# Patient Record
Sex: Female | Born: 1950 | Race: White | Hispanic: No | Smoking: Never smoker
Health system: Southern US, Community
[De-identification: ages and names within clinical notes are randomized; demographics above are authoritative.]

## PROBLEM LIST (undated history)

## (undated) DIAGNOSIS — I10 Essential (primary) hypertension: Secondary | ICD-10-CM

## (undated) DIAGNOSIS — C50919 Malignant neoplasm of unspecified site of unspecified female breast: Secondary | ICD-10-CM

## (undated) DIAGNOSIS — C801 Malignant (primary) neoplasm, unspecified: Secondary | ICD-10-CM

## (undated) DIAGNOSIS — G4733 Obstructive sleep apnea (adult) (pediatric): Secondary | ICD-10-CM

## (undated) DIAGNOSIS — Z923 Personal history of irradiation: Secondary | ICD-10-CM

## (undated) HISTORY — DX: Essential (primary) hypertension: I10

## (undated) HISTORY — PX: BREAST BIOPSY: SHX20

## (undated) HISTORY — DX: Obstructive sleep apnea (adult) (pediatric): G47.33

## (undated) HISTORY — PX: BREAST EXCISIONAL BIOPSY: SUR124

## (undated) HISTORY — PX: BREAST LUMPECTOMY: SHX2

## (undated) HISTORY — DX: Malignant (primary) neoplasm, unspecified: C80.1

---

## 1998-01-08 DIAGNOSIS — C801 Malignant (primary) neoplasm, unspecified: Secondary | ICD-10-CM

## 1998-01-08 HISTORY — PX: BREAST SURGERY: SHX581

## 1998-01-08 HISTORY — DX: Malignant (primary) neoplasm, unspecified: C80.1

## 1998-04-01 ENCOUNTER — Encounter: Payer: Self-pay | Admitting: General Surgery

## 1998-04-04 ENCOUNTER — Ambulatory Visit (HOSPITAL_COMMUNITY): Admission: RE | Admit: 1998-04-04 | Discharge: 1998-04-05 | Payer: Self-pay | Admitting: General Surgery

## 1998-04-04 ENCOUNTER — Encounter: Payer: Self-pay | Admitting: General Surgery

## 1998-04-20 ENCOUNTER — Ambulatory Visit (HOSPITAL_COMMUNITY): Admission: RE | Admit: 1998-04-20 | Discharge: 1998-04-20 | Payer: Self-pay | Admitting: General Surgery

## 1998-04-27 ENCOUNTER — Encounter: Admission: RE | Admit: 1998-04-27 | Discharge: 1998-07-26 | Payer: Self-pay | Admitting: Family Medicine

## 1998-07-27 ENCOUNTER — Encounter: Admission: RE | Admit: 1998-07-27 | Discharge: 1998-10-25 | Payer: Self-pay | Admitting: Radiation Oncology

## 1998-08-09 ENCOUNTER — Encounter: Admission: RE | Admit: 1998-08-09 | Discharge: 1998-11-07 | Payer: Self-pay | Admitting: Family Medicine

## 2000-01-09 HISTORY — PX: ABDOMINAL HYSTERECTOMY: SHX81

## 2000-03-05 ENCOUNTER — Ambulatory Visit: Admission: RE | Admit: 2000-03-05 | Discharge: 2000-03-05 | Payer: Self-pay | Admitting: Gynecology

## 2000-03-07 ENCOUNTER — Other Ambulatory Visit: Admission: RE | Admit: 2000-03-07 | Discharge: 2000-03-07 | Payer: Self-pay | Admitting: Gynecology

## 2000-03-14 ENCOUNTER — Encounter: Payer: Self-pay | Admitting: Gynecology

## 2000-03-19 ENCOUNTER — Inpatient Hospital Stay (HOSPITAL_COMMUNITY): Admission: RE | Admit: 2000-03-19 | Discharge: 2000-03-23 | Payer: Self-pay | Admitting: Gynecology

## 2000-04-30 ENCOUNTER — Ambulatory Visit: Admission: RE | Admit: 2000-04-30 | Discharge: 2000-04-30 | Payer: Self-pay | Admitting: Gynecology

## 2004-06-06 ENCOUNTER — Encounter: Admission: RE | Admit: 2004-06-06 | Discharge: 2004-06-06 | Payer: Self-pay | Admitting: General Surgery

## 2010-08-03 ENCOUNTER — Other Ambulatory Visit: Payer: Self-pay | Admitting: General Surgery

## 2010-08-03 DIAGNOSIS — Z853 Personal history of malignant neoplasm of breast: Secondary | ICD-10-CM

## 2010-08-18 ENCOUNTER — Other Ambulatory Visit: Payer: Self-pay | Admitting: Family Medicine

## 2010-08-18 ENCOUNTER — Ambulatory Visit
Admission: RE | Admit: 2010-08-18 | Discharge: 2010-08-18 | Disposition: A | Discharge status: Home / Self Care | Payer: Managed Care, Other (non HMO) | Source: Ambulatory Visit | Attending: General Surgery | Admitting: General Surgery

## 2010-08-18 DIAGNOSIS — Z853 Personal history of malignant neoplasm of breast: Secondary | ICD-10-CM

## 2010-09-15 ENCOUNTER — Encounter (INDEPENDENT_AMBULATORY_CARE_PROVIDER_SITE_OTHER): Payer: Self-pay | Admitting: Surgery

## 2010-09-15 ENCOUNTER — Ambulatory Visit (INDEPENDENT_AMBULATORY_CARE_PROVIDER_SITE_OTHER): Payer: Managed Care, Other (non HMO) | Admitting: Surgery

## 2010-09-15 NOTE — Progress Notes (Signed)
Subjective:     Patient ID: Hannah Branch, female   DOB: 14-Jun-1950, 60 y.o.   MRN: [id number]  HPIMrs. Lorenzen has been our seminar and is injected in a Roux-en-Y gastric bypass. Her primary care doctor is Dr. Keturah Barre at [facility] family practice.she is a patient followed in the breast center and previously a breast cancer patient Dr. Mikal Plane. In April 2000 she had a right partial mastectomy with wide excision partial mastectomy.  As an adult, she has had issues with overweight. Currently her BMI is 58. Today's weight is 327. She wants to proceed along the workup pathway for a lap covered Roux-en-Y gastric bypass.   Review of Systems  Constitutional: Negative.   HENT: Negative.   Eyes: Negative.   Respiratory: Negative.   Cardiovascular: Negative.   Gastrointestinal: Negative.   Genitourinary: Negative.   Musculoskeletal: Negative.   Skin: Negative.   Neurological: Negative.   Hematological: Negative.   Psychiatric/Behavioral: Negative.    No Known Allergies Current Outpatient Prescriptions  Medication Sig Dispense Refill  . Ibuprofen-Diphenhydramine Cit (IBUPROFEN PM PO) Take by mouth at bedtime and may repeat dose one time if needed.        Marland Kitchen losartan-hydrochlorothiazide (HYZAAR) 100-25 MG per tablet Take 1 tablet by mouth daily.        . naproxen (NAPROSYN) 250 MG tablet Take 250 mg by mouth as needed.         Past Medical History  Diagnosis Date  . Cancer 2000    breast- right, uterine  . Hypertension    Past Surgical History  Procedure Date  . Breast surgery 2000    lumpectomy x2  . Abdominal hysterectomy 2002       Objective:   Physical Exam  Constitutional: She is oriented to person, place, and time. She appears well-nourished.  HENT:  Head: Normocephalic and atraumatic.  Eyes: Conjunctivae and EOM are normal. Pupils are equal, round, and reactive to light.  Neck: Neck supple.  Cardiovascular: Normal rate and regular rhythm.   Pulmonary/Chest: Effort  normal and breath sounds normal.  Abdominal: Soft.       Lower abdominal incision from hysterectomy  Musculoskeletal: Normal range of motion.  Neurological: She is alert and oriented to person, place, and time.  Skin: Skin is warm and dry.  Psychiatric: She has a normal mood and affect. Her behavior is normal. Judgment and thought content normal.  obese white female in no acute distress weight 327, BMI 58, blood pressure 150/98, afebrile, heart rate 80, respirations 12, height 5 feet 3 inches, and girth 60 inches.       Assessment:     Morbid obesity with BMI 58 History of breast cancer    Plan:     Workup for laparoscopic Roux en Y gastric bypass

## 2010-09-18 ENCOUNTER — Other Ambulatory Visit (INDEPENDENT_AMBULATORY_CARE_PROVIDER_SITE_OTHER): Payer: Self-pay | Admitting: Surgery

## 2010-09-21 ENCOUNTER — Encounter: Payer: Managed Care, Other (non HMO) | Attending: Surgery | Admitting: *Deleted

## 2010-09-21 ENCOUNTER — Other Ambulatory Visit (INDEPENDENT_AMBULATORY_CARE_PROVIDER_SITE_OTHER): Payer: Self-pay | Admitting: Surgery

## 2010-09-21 ENCOUNTER — Encounter: Payer: Self-pay | Admitting: *Deleted

## 2010-09-21 DIAGNOSIS — Z713 Dietary counseling and surveillance: Secondary | ICD-10-CM | POA: Insufficient documentation

## 2010-09-21 DIAGNOSIS — Z01818 Encounter for other preprocedural examination: Secondary | ICD-10-CM | POA: Insufficient documentation

## 2010-09-21 NOTE — Patient Instructions (Signed)
   Follow Pre-Op Nutrition Goals to prepare for Gastric Bypass Surgery.   Call the [facility] at [phone] once you have been given your surgery date to enrolled in the Pre-Op Nutrition Class. You will need to attend this nutrition class 3-4 weeks prior to your surgery. 

## 2010-09-21 NOTE — Progress Notes (Signed)
  Patient was seen on 09/21/2010 for Pre-Operative Gastric Bypass Nutrition Assessment. Assessment and letter of approval faxed to [facility] Bariatric Surgery Program coordinator on 09/21/2010.    Handouts given during visit include:  Pre-Op Goals Handout  Patient to call for Pre-Op and Post-Op Nutrition Education at the [facility] when surgery is scheduled.

## 2010-09-22 LAB — CBC
MCH: 28.1 pg (ref 26.0–34.0)
Platelets: 183 10*3/uL (ref 150–400)
RBC: 4.34 MIL/uL (ref 3.87–5.11)
WBC: 4.3 10*3/uL (ref 4.0–10.5)

## 2010-09-22 LAB — COMPREHENSIVE METABOLIC PANEL
ALT: 13 U/L (ref 0–35)
CO2: 29 mEq/L (ref 19–32)
Chloride: 99 mEq/L (ref 96–112)
Sodium: 140 mEq/L (ref 135–145)
Total Bilirubin: 0.6 mg/dL (ref 0.3–1.2)
Total Protein: 7.4 g/dL (ref 6.0–8.3)

## 2010-09-22 LAB — LIPID PANEL
Cholesterol: 180 mg/dL (ref 0–200)
VLDL: 29 mg/dL (ref 0–40)

## 2010-09-22 LAB — TSH: TSH: 0.993 u[IU]/mL (ref 0.350–4.500)

## 2010-09-28 ENCOUNTER — Other Ambulatory Visit: Payer: Self-pay

## 2010-09-28 ENCOUNTER — Ambulatory Visit (HOSPITAL_COMMUNITY)
Admission: RE | Admit: 2010-09-28 | Discharge: 2010-09-28 | Disposition: A | Discharge status: Home / Self Care | Payer: Managed Care, Other (non HMO) | Source: Ambulatory Visit | Attending: Surgery | Admitting: Surgery

## 2010-09-28 DIAGNOSIS — I10 Essential (primary) hypertension: Secondary | ICD-10-CM | POA: Insufficient documentation

## 2010-09-28 DIAGNOSIS — K449 Diaphragmatic hernia without obstruction or gangrene: Secondary | ICD-10-CM | POA: Insufficient documentation

## 2010-09-28 DIAGNOSIS — Z6841 Body Mass Index (BMI) 40.0 and over, adult: Secondary | ICD-10-CM | POA: Insufficient documentation

## 2010-10-10 ENCOUNTER — Ambulatory Visit (HOSPITAL_COMMUNITY)
Admission: RE | Admit: 2010-10-10 | Discharge: 2010-10-10 | Disposition: A | Discharge status: Home / Self Care | Payer: Managed Care, Other (non HMO) | Source: Ambulatory Visit | Attending: Surgery | Admitting: Surgery

## 2010-10-10 ENCOUNTER — Ambulatory Visit (HOSPITAL_BASED_OUTPATIENT_CLINIC_OR_DEPARTMENT_OTHER): Payer: Managed Care, Other (non HMO) | Attending: Surgery

## 2010-10-10 DIAGNOSIS — G4733 Obstructive sleep apnea (adult) (pediatric): Secondary | ICD-10-CM | POA: Insufficient documentation

## 2010-10-10 DIAGNOSIS — Z6841 Body Mass Index (BMI) 40.0 and over, adult: Secondary | ICD-10-CM | POA: Insufficient documentation

## 2010-10-10 DIAGNOSIS — Z01818 Encounter for other preprocedural examination: Secondary | ICD-10-CM | POA: Insufficient documentation

## 2010-10-10 HISTORY — DX: Obstructive sleep apnea (adult) (pediatric): G47.33

## 2010-10-14 DIAGNOSIS — R0609 Other forms of dyspnea: Secondary | ICD-10-CM

## 2010-10-14 DIAGNOSIS — G4733 Obstructive sleep apnea (adult) (pediatric): Secondary | ICD-10-CM

## 2010-10-14 DIAGNOSIS — R0989 Other specified symptoms and signs involving the circulatory and respiratory systems: Secondary | ICD-10-CM

## 2010-10-14 NOTE — Procedures (Signed)
Hannah Branch, MUSIC                 ACCOUNT NO.:  [id number]  MEDICAL RECORD NO.:  [id number]          PATIENT TYPE:  OUT  LOCATION:  SLEEP CENTER                 FACILITY:  [facility]  PHYSICIAN:  Adaleah Forget D. Maple Hudson, MD, FCCP, FACPDATE OF BIRTH:  July 23[birth year] 1949  DATE OF STUDY:  10/10/2010                           NOCTURNAL POLYSOMNOGRAM  REFERRING PHYSICIAN:  Thornton Park. Daphine Deutscher, MD  INDICATION FOR STUDY:  Hypersomnia with sleep apnea.  EPWORTH SLEEPINESS SCORE:  Epworth sleepiness score 5/24.  BMI 53.1. Weight 300 pounds, height 63 inches.  Neck 17 inches.  Home medications are charted and reviewed.  MEDICATIONS:  SLEEP ARCHITECTURE:  Split study protocol.  During the diagnostic phase, total sleep time 137 minutes with sleep efficiency 83.3%.  Stage I was 14.2%, stage II 85.8%, stages III and REM were absent.  Sleep latency 8.5 minutes, awake after sleep onset 18.5 minutes, arousal index 83.2. Bedtime medication:  None.  RESPIRATORY DATA:  Split study protocol.  Apnea/hypopnea index (AHI) 122.2 per hour.  A total of 279 events were scored including 82 obstructive apneas, 2 central apneas, 3 mixed apneas, 192 hypopneas. Events were not positional.  CPAP was then titrated to 19 CWP, AHI 0 per hour.  She wore a medium ResMed Mirage Quattro full-face mask with heated humidifier and a C-flex setting of 3.  OXYGEN DATA:  Moderately loud snoring with oxygen desaturation to a nadir of 65% on room air.  During CPAP titration mean oxygen saturation held 86.9% on room air.  CARDIAC DATA:  Normal sinus rhythm.  MOVEMENT-PARASOMNIA:  No significant movement disturbance.  Bathroom x1.  IMPRESSIONS-RECOMMENDATIONS: 1. Severe obstructive sleep apnea/hypopnea syndrome, AHI 122.2 per     hour with nonpositional events, moderately loud snoring and oxygen     desaturation to a nadir of 65% on room air. 2. Successful CPAP titration to 19 CWP, AHI 0 per hour.  The patient     wore a medium ResMed Mirage  Quattro full-face mask with heated     humidifier and a C-flex setting of 3. 3. Room air oxygen saturation on arrival was normal at 96%.  There was     significant desaturation during sleep as noted.  The oxygen     saturation recorded during CPAP titration remained low at 86.9% on     room air raising     possibility that this patient desaturates significantly during     sleep and may benefit from an overnight oximetry assessment while     wearing CPAP on room air in the home environment.     Ranell Finelli D. Maple Hudson, MD, [facility], FACP Diplomate, Biomedical engineer of Sleep Medicine Electronically Signed    CDY/MEDQ  D:  10/14/2010 11:26:30  T:  10/14/2010 11:43:56  Job:  [id number]

## 2010-11-13 ENCOUNTER — Other Ambulatory Visit (INDEPENDENT_AMBULATORY_CARE_PROVIDER_SITE_OTHER): Payer: Self-pay | Admitting: Surgery

## 2010-11-14 ENCOUNTER — Other Ambulatory Visit (INDEPENDENT_AMBULATORY_CARE_PROVIDER_SITE_OTHER): Payer: Self-pay | Admitting: Surgery

## 2010-11-15 ENCOUNTER — Encounter: Payer: Self-pay | Admitting: Pulmonary Disease

## 2010-11-15 ENCOUNTER — Ambulatory Visit (INDEPENDENT_AMBULATORY_CARE_PROVIDER_SITE_OTHER): Payer: Managed Care, Other (non HMO) | Admitting: Pulmonary Disease

## 2010-11-15 VITALS — BP 122/76 | HR 80 | Temp 98.5°F | Ht 63.0 in | Wt 318.4 lb

## 2010-11-15 DIAGNOSIS — G4733 Obstructive sleep apnea (adult) (pediatric): Secondary | ICD-10-CM

## 2010-11-15 NOTE — Assessment & Plan Note (Signed)
She has very severe sleep apnea.  I have reviewed his sleep test results with the patient.  Explained how sleep apnea can affect the patient's health.  Driving precautions and importance of weight loss were discussed.  Treatment options for sleep apnea were reviewed.  Will proceed with CPAP set up at 19 cm H2O.  She will need to do overnight oximetry while on CPAP to determine if she needs to use supplemental oxygen with CPAP.  She can otherwise proceed with bariatric surgery.  Pulmonary service can be available as needed during the peri-operative period.    Advised that she will need to have her sleep apnea re-assessed once her weight is stablized after surgery.

## 2010-11-15 NOTE — Patient Instructions (Signed)
Follow:    Pre-Op Diet per MD 2 weeks prior to surgery  Phase 2- Liquids (clear/full) 2 weeks after surgery  Vitamin/Mineral/Calcium guidelines for purchasing bariatric supplements  Exercise guidelines pre and post-op per MD  Follow-up at [facility] in 2 weeks post-op for diet advancement. Contact Keniesha Adderly as needed with questions/concerns.  

## 2010-11-15 NOTE — Progress Notes (Signed)
Chief Complaint  Patient presents with  . Advice Only    Pt had sleep study 10/2010 and did not get results yet. Pt scheduled to have gastric bypass 12/12/10. Pt c/o waking up several times during the night (due to using bathroom), loud snoring    History of Present Illness: Hannah Branch is a 60 y.o. female seen for sleep apnea.  She is being evaluated for bariatric surgery.  During this evaluation there was concern for sleep apnea.  She had sleep test from October 10, 2010>>AHI 122.2, SpO2 low 65%.  She used CPAP 19 cm H2O with AHI reduced to 0.  She did still have trouble with her oxygenation even with CPAP, but did not use oxygen during the test.  She goes to bed at 10 pm.  She falls asleep quickly.  She wakes up several times to use the bathroom.  She will occasionally take a tylenol pm.  She gets out of bed at 6 am.  She feels tired at times in the morning.  She gets frequent headaches during the day.  She does snore, but is unsure if she stops breathing while asleep.  She is unaware otherwise of having trouble with her sleep.  She does not use anything to help his stay awake during the day.  The patient denies sleep walking, sleep talking, bruxism, or nightmares.  There is no history of restless legs.  The patient denies sleep hallucinations, sleep paralysis, or cataplexy.  Her Epworth score is 3 out of 24.  Past Medical History  Diagnosis Date  . Cancer 2000    breast- right, uterine  . Hypertension   . OSA (obstructive sleep apnea) 10/10/10    Past Surgical History  Procedure Date  . Breast surgery 2000    lumpectomy x2 right  . Abdominal hysterectomy 2002    Current Outpatient Prescriptions on File Prior to Visit  Medication Sig Dispense Refill  . Ibuprofen-Diphenhydramine Cit (IBUPROFEN PM PO) Take by mouth at bedtime and may repeat dose one time if needed.        Marland Kitchen losartan-hydrochlorothiazide (HYZAAR) 100-25 MG per tablet Take 1 tablet by mouth daily.          No  Known Allergies  family history includes Cancer in her maternal aunt and maternal grandmother and Emphysema in her father.   reports that she has never smoked. She does not have any smokeless tobacco history on file. She reports that she does not drink alcohol or use illicit drugs.  Blood pressure 122/76, pulse 80, temperature 98.5 F (36.9 C), temperature source Oral, height 5\' 3"  (1.6 m), weight 318 lb 6.4 oz (144.425 kg), SpO2 96.00%.  Physical Exam:  General - Obese HEENT - PERRLA, EOMI, no sinus tenderness, MP 3, no oral exudate, no LAN, no thyromegaly Cardiac - s1s2 no murmur Chest - no wheeze/rales/dullness Abdomen - soft, nontender Extremities - no e/c/c Neurologic - normal strength, CN intact Skin - no rashes Psychiatric - normal mood, behavior  Assessment/Plan:  OSA (obstructive sleep apnea) She has very severe sleep apnea.  I have reviewed his sleep test results with the patient.  Explained how sleep apnea can affect the patient's health.  Driving precautions and importance of weight loss were discussed.  Treatment options for sleep apnea were reviewed.  Will proceed with CPAP set up at 19 cm H2O.  She will need to do overnight oximetry while on CPAP to determine if she needs to use supplemental oxygen with CPAP.  She  can otherwise proceed with bariatric surgery.  Pulmonary service can be available as needed during the peri-operative period.    Advised that she will need to have her sleep apnea re-assessed once her weight is stablized after surgery.     Outpatient Encounter Prescriptions as of 11/15/2010  Medication Sig Dispense Refill  . Ibuprofen-Diphenhydramine Cit (IBUPROFEN PM PO) Take by mouth at bedtime and may repeat dose one time if needed.        Marland Kitchen losartan-hydrochlorothiazide (HYZAAR) 100-25 MG per tablet Take 1 tablet by mouth daily.        Marland Kitchen DISCONTD: naproxen (NAPROSYN) 250 MG tablet Take 250 mg by mouth as needed.          Hannah Branch 11/15/2010,  3:55 PM

## 2010-11-15 NOTE — Progress Notes (Deleted)
  Subjective:    Patient ID: Hannah Branch, female    DOB: Sep 05, 1950, 60 y.o.   MRN: [id number]  HPI    Review of Systems  Respiratory: Positive for shortness of breath.   Musculoskeletal: Positive for joint swelling.       Objective:   Physical Exam        Assessment & Plan:

## 2010-11-15 NOTE — Patient Instructions (Signed)
Will arrange for CPAP set up Follow up in 2 to 3 months 

## 2010-11-15 NOTE — Progress Notes (Signed)
  Bariatric Class:  Appt start time: 1700 end time:  1800.  Pre-Operative Nutrition Class  Patient was seen on 11/16/10 for Pre-Operative Nutrition education at [facility].   Surgery date: 12/12/10 Surgery type: Gastric Bypass  Weight today: 322.4lb  Weight change: 1.1 lb increase Total weight lost: N/A BMI: 57  Samples given per MNT protocol: Bariatric Advantage Multivitamin Lot #[id number] Exp: 9/13  Bariatric Advantage Calcium Citrate Lot # [id number] Exp: 9/13  Celebrate Vitamins Multivitamin Lot # [id number] Exp: 6/14  Celebrate Vitamins Calcium Citrate Lot # [id number] Exp: 9/13  Unjury Protein: Chocolate Lot# [id number] Exp: 1/14  The following the learning objective met by the patient during this course:   Identifies Pre-Op Dietary Goals and will begin 2 weeks pre-operatively   Identifies appropriate sources of fluids and proteins   States protein recommendations and appropriate sources pre and post-operatively  Identifies Post-Operative Dietary Goals and will follow for 2 weeks post-operatively  Identifies appropriate multivitamin and calcium sources  Describes the need for physical activity post-operatively and will follow MD recommendations  States when to call healthcare provider regarding medication questions or post-operative complications  Handouts given during class include:  Pre-Op Bariatric Surgery Diet Handout  Protein Shake Handout  Post-Op Bariatric Surgery Nutrition Handout  BELT Program Information Flyer  Support Group Information Flyer  Follow-Up Plan: Patient will follow-up at [facility] 2 weeks post operatively for diet advancement per MD.

## 2010-11-16 ENCOUNTER — Encounter: Payer: Managed Care, Other (non HMO) | Attending: Surgery

## 2010-11-16 DIAGNOSIS — Z01818 Encounter for other preprocedural examination: Secondary | ICD-10-CM | POA: Insufficient documentation

## 2010-11-16 DIAGNOSIS — Z713 Dietary counseling and surveillance: Secondary | ICD-10-CM | POA: Insufficient documentation

## 2010-11-29 ENCOUNTER — Other Ambulatory Visit (INDEPENDENT_AMBULATORY_CARE_PROVIDER_SITE_OTHER): Payer: Self-pay | Admitting: General Surgery

## 2010-11-29 ENCOUNTER — Encounter (HOSPITAL_COMMUNITY): Payer: Self-pay | Admitting: Pharmacy Technician

## 2010-11-29 ENCOUNTER — Telehealth: Payer: Self-pay | Admitting: Pulmonary Disease

## 2010-11-29 DIAGNOSIS — G4733 Obstructive sleep apnea (adult) (pediatric): Secondary | ICD-10-CM

## 2010-11-29 NOTE — Telephone Encounter (Signed)
I spoke with pt and she states she wants to have her machine put on auto mode. Pt states she thinks her pressure needs to be readjusted. She is getting where she is not able to sleep at night. Pt is aware [facility] is out of the office until next week. Please advise Dr. Craige Cotta, thanks

## 2010-12-04 ENCOUNTER — Encounter (HOSPITAL_COMMUNITY)
Admission: RE | Admit: 2010-12-04 | Discharge: 2010-12-04 | Disposition: A | Discharge status: Home / Self Care | Payer: Managed Care, Other (non HMO) | Source: Ambulatory Visit | Attending: Surgery | Admitting: Surgery

## 2010-12-04 ENCOUNTER — Encounter (HOSPITAL_COMMUNITY): Payer: Self-pay

## 2010-12-04 LAB — DIFFERENTIAL
Lymphocytes Relative: 9 % — ABNORMAL LOW (ref 12–46)
Lymphs Abs: 0.6 10*3/uL — ABNORMAL LOW (ref 0.7–4.0)
Monocytes Absolute: 0.6 10*3/uL (ref 0.1–1.0)
Monocytes Relative: 9 % (ref 3–12)
Neutro Abs: 5.3 10*3/uL (ref 1.7–7.7)
Neutrophils Relative %: 79 % — ABNORMAL HIGH (ref 43–77)

## 2010-12-04 LAB — COMPREHENSIVE METABOLIC PANEL
AST: 28 U/L (ref 0–37)
Albumin: 3.8 g/dL (ref 3.5–5.2)
Calcium: 10 mg/dL (ref 8.4–10.5)
Chloride: 96 mEq/L (ref 96–112)
Creatinine, Ser: 0.76 mg/dL (ref 0.50–1.10)
Total Bilirubin: 0.7 mg/dL (ref 0.3–1.2)
Total Protein: 7.8 g/dL (ref 6.0–8.3)

## 2010-12-04 LAB — CBC
MCHC: 33.3 g/dL (ref 30.0–36.0)
MCV: 89.5 fL (ref 78.0–100.0)
Platelets: 211 10*3/uL (ref 150–400)
RDW: 14.6 % (ref 11.5–15.5)
WBC: 6.7 10*3/uL (ref 4.0–10.5)

## 2010-12-04 LAB — SURGICAL PCR SCREEN: Staphylococcus aureus: NEGATIVE

## 2010-12-04 NOTE — Pre-Procedure Instructions (Signed)
Chest xray 09-28-10 in epic under imaging ekg 09-28-10 in epic under ecg Sleep study  11-15-10 in epic under  encounter

## 2010-12-04 NOTE — Telephone Encounter (Signed)
Pt is aware order was sent and needed nothing further 

## 2010-12-04 NOTE — Telephone Encounter (Signed)
Please advise pt that I have sent order for auto-CPAP.

## 2010-12-04 NOTE — Patient Instructions (Addendum)
20 Hannah Branch  12/04/2010   Your procedure is scheduled on:  12-12-10  Report to [facility] at 915 AM.  Call this number if you have problems the morning of surgery: [phone]   Remember:   Do not eat food:After Midnight.  May have clear liquids:until Midnight .  Clear liquids include soda, tea, black coffee, apple or grape juice, broth.  Take these medicines the morning of surgery with A SIP OF WATER: none   Do not wear jewelry, make-up or nail polish.  Do not wear lotions, powders, or perfumes.   Do not shave 48 hours prior to surgery.  Do not bring valuables to the hospital.  Contacts, dentures or bridgework may not be worn into surgery.  Leave suitcase in the car. After surgery it may be brought to your room.  For patients admitted to the hospital, checkout time is 11:00 AM the day of discharge.   Patients discharged the day of surgery will not be allowed to drive home.  Name and phone number of your driver:  Special Instructions: CHG Shower Use Special Wash: 1/2 bottle night before surgery and 1/2 bottle morning of surgery.bring cpap mask only Jasmine December pre op nurse call if any changes in medicines, or medical history phone number [phone], check with dr Daphine Deutscher office about bowel prep   Please read over the following fact sheets that you were given: MRSA Information

## 2010-12-06 ENCOUNTER — Encounter (INDEPENDENT_AMBULATORY_CARE_PROVIDER_SITE_OTHER): Payer: Self-pay | Admitting: Surgery

## 2010-12-06 ENCOUNTER — Ambulatory Visit (INDEPENDENT_AMBULATORY_CARE_PROVIDER_SITE_OTHER): Payer: Managed Care, Other (non HMO) | Admitting: Surgery

## 2010-12-06 VITALS — BP 132/88 | HR 80 | Temp 96.9°F | Resp 16 | Ht 63.0 in | Wt 302.6 lb

## 2010-12-06 DIAGNOSIS — E669 Obesity, unspecified: Secondary | ICD-10-CM

## 2010-12-06 NOTE — Progress Notes (Signed)
Subjective:     Patient ID: Hannah Branch, female   DOB: 1950-09-10, 60 y.o.   MRN: [id number]  HPIMrs. Branch has been our seminar and is interested  in a Roux-en-Y gastric bypass. Her primary care doctor is Dr. Keturah Barre at [facility] family practice.she is a patient followed in the breast center and previously a breast cancer patient Dr. Mikal Plane. In April 2000 she had a right partial mastectomy with wide excision partial mastectomy.  As an adult, she has had issues with overweight. Currently her BMI is 58. Today's weight is 327. She wants to proceed along the workup pathway for a lap covered Roux-en-Y gastric bypass.   Review of Systems  Constitutional: Negative.   HENT: Negative.   Eyes: Negative.   Respiratory: Negative.   Cardiovascular: Negative.   Gastrointestinal: Negative.   Genitourinary: Negative.   Musculoskeletal: Negative.   Skin: Negative.   Neurological: Negative.   Hematological: Negative.   Psychiatric/Behavioral: Negative.    No Known Allergies Current Outpatient Prescriptions  Medication Sig Dispense Refill  . Ibuprofen-Diphenhydramine Cit (IBUPROFEN PM PO) Take 1-2 tablets by mouth at bedtime and may repeat dose one time if needed. Sleep       . losartan-hydrochlorothiazide (HYZAAR) 100-25 MG per tablet Take 1 tablet by mouth every evening.        Past Medical History  Diagnosis Date  . Hypertension   . OSA (obstructive sleep apnea) 10/10/10    pt unsure about cpap setting, making adjustments, pt will tell us setting  day of surgery  . Cancer 2000    breast- right, uterine, radiation tx done   Past Surgical History  Procedure Date  . Breast surgery 2000    lumpectomy x2 right  . Abdominal hysterectomy 2002    complete hysterectomy       Objective:   Physical Exam  Constitutional: She is oriented to person, place, and time. She appears well-nourished.  HENT:  Head: Normocephalic and atraumatic.  Eyes: Conjunctivae and EOM are normal. Pupils are  equal, round, and reactive to light.  Neck: Neck supple.  Cardiovascular: Normal rate and regular rhythm.   Pulmonary/Chest: Effort normal and breath sounds normal.  Abdominal: Soft.       Lower abdominal incision from hysterectomy  Musculoskeletal: Normal range of motion.  Neurological: She is alert and oriented to person, place, and time.  Skin: Skin is warm and dry.  Psychiatric: She has a normal mood and affect. Her behavior is normal. Judgment and thought content normal.  obese white female in no acute distress weight 327, BMI 58, blood pressure 150/98, afebrile, heart rate 80, respirations 12, height 5 feet 3 inches, and girth 60 inches.       Assessment:     Morbid obesity with BMI 58 History of breast cancer    Plan:     Workup for laparoscopic Roux en Y gastric bypass  Since she was seen initially she has lost down to a weight of 302 which puts her BMI 53.6. She comes in today in rechecked her and assessed her physical findings and they were essentially unchanged from before. She will take oral bowel prep for her Roux-en-Y gastric bypass which is scheduled for this coming Tuesday. She's had a little upper respiratory tract infection has been treated with antibiotics per Dr. Sherral Hammers. Today her lungs sounded clear and heart sounds were sinus rhythm without murmurs or gallops.  Plan laparoscopic Roux-en-Y gastric bypass this coming Tuesday. I have discussed the fact  that with her previous history of uterine cancer and hysterectomy that there is a chance that she had really severe adhesions we would be unable to do this procedure.

## 2010-12-07 ENCOUNTER — Telehealth: Payer: Self-pay | Admitting: Pulmonary Disease

## 2010-12-07 ENCOUNTER — Telehealth (INDEPENDENT_AMBULATORY_CARE_PROVIDER_SITE_OTHER): Payer: Self-pay | Admitting: General Surgery

## 2010-12-07 NOTE — Telephone Encounter (Signed)
Called and spoke with pt.  Pt states Hannah Branch has already spoke with her and is handling what she is needing as it an insurance issue and is therefore not needing anything from our office.  Will sign off on message.

## 2010-12-07 NOTE — Telephone Encounter (Signed)
Patient called after receiving two day bowel prep for surgery yesterday and was not given RX for antibiotics. I spoke with French Ana who advised per Dr Daphine Deutscher she did not need the antibiotics but to follow the rest of the prep instructions. Patient made aware.

## 2010-12-11 SURGERY — LAPAROSCOPIC ROUX-EN-Y GASTRIC
Anesthesia: General

## 2010-12-12 ENCOUNTER — Encounter (HOSPITAL_COMMUNITY): Payer: Self-pay | Admitting: *Deleted

## 2010-12-12 ENCOUNTER — Encounter (HOSPITAL_COMMUNITY): Payer: Self-pay | Admitting: Anesthesiology

## 2010-12-12 ENCOUNTER — Inpatient Hospital Stay (HOSPITAL_COMMUNITY)
Admission: RE | Admit: 2010-12-12 | Discharge: 2010-12-15 | DRG: 621 | Disposition: A | Discharge status: Home / Self Care | Payer: Managed Care, Other (non HMO) | Source: Ambulatory Visit | Attending: Surgery | Admitting: Surgery

## 2010-12-12 ENCOUNTER — Encounter (HOSPITAL_COMMUNITY)
Admission: RE | Disposition: A | Discharge status: Home / Self Care | Payer: Self-pay | Source: Ambulatory Visit | Attending: Surgery

## 2010-12-12 ENCOUNTER — Inpatient Hospital Stay (HOSPITAL_COMMUNITY): Payer: Managed Care, Other (non HMO) | Admitting: Anesthesiology

## 2010-12-12 DIAGNOSIS — G4733 Obstructive sleep apnea (adult) (pediatric): Secondary | ICD-10-CM

## 2010-12-12 DIAGNOSIS — Z6841 Body Mass Index (BMI) 40.0 and over, adult: Secondary | ICD-10-CM

## 2010-12-12 HISTORY — PX: GASTRIC ROUX-EN-Y: SHX5262

## 2010-12-12 LAB — CREATININE, SERUM: GFR calc non Af Amer: 78 mL/min — ABNORMAL LOW (ref >90)

## 2010-12-12 LAB — HEMOGLOBIN AND HEMATOCRIT, BLOOD: Hemoglobin: 12.4 g/dL (ref 12.0–15.0)

## 2010-12-12 SURGERY — LAPAROSCOPIC ROUX-EN-Y GASTRIC
Anesthesia: General | Site: Abdomen | Wound class: Clean Contaminated

## 2010-12-12 MED ORDER — BUPIVACAINE LIPOSOME 1.3 % IJ SUSP
20.0000 mL | Freq: Once | INTRAMUSCULAR | Status: DC
  Filled 2010-12-12: qty 20

## 2010-12-12 MED ORDER — ACETAMINOPHEN 10 MG/ML IV SOLN
INTRAVENOUS | Status: DC | PRN
  Administered 2010-12-12: 1000 mg via INTRAVENOUS

## 2010-12-12 MED ORDER — CISATRACURIUM BESYLATE 2 MG/ML IV SOLN
INTRAVENOUS | Status: DC | PRN
  Administered 2010-12-12: 10 mg via INTRAVENOUS
  Administered 2010-12-12: 4 mg via INTRAVENOUS
  Administered 2010-12-12: 2 mg via INTRAVENOUS

## 2010-12-12 MED ORDER — FIBRIN SEALANT COMPONENT 5 ML EX KIT
PACK | CUTANEOUS | Status: AC
  Filled 2010-12-12: qty 2

## 2010-12-12 MED ORDER — UNJURY CHICKEN SOUP POWDER: 2.0000 [oz_av] | Freq: Four times a day (QID) | ORAL | Status: DC

## 2010-12-12 MED ORDER — LACTATED RINGERS IV SOLN
INTRAVENOUS | Status: DC | PRN
  Administered 2010-12-12 (×3): via INTRAVENOUS

## 2010-12-12 MED ORDER — LACTATED RINGERS IV SOLN: INTRAVENOUS | Status: DC

## 2010-12-12 MED ORDER — HEPARIN SODIUM (PORCINE) 5000 UNIT/ML IJ SOLN
5000.0000 [IU] | Freq: Three times a day (TID) | INTRAMUSCULAR | Status: DC
  Administered 2010-12-12 – 2010-12-15 (×9): 5000 [IU] via SUBCUTANEOUS
  Filled 2010-12-12 (×12): qty 1

## 2010-12-12 MED ORDER — ONDANSETRON HCL 4 MG/2ML IJ SOLN
4.0000 mg | INTRAMUSCULAR | Status: DC | PRN
  Administered 2010-12-12 – 2010-12-13 (×3): 4 mg via INTRAVENOUS
  Filled 2010-12-12 (×3): qty 2

## 2010-12-12 MED ORDER — UNJURY VANILLA POWDER: 2.0000 [oz_av] | Freq: Four times a day (QID) | ORAL | Status: DC

## 2010-12-12 MED ORDER — BUPIVACAINE LIPOSOME 1.3 % IJ SUSP
INTRAMUSCULAR | Status: DC | PRN
  Administered 2010-12-12: 20 mL

## 2010-12-12 MED ORDER — MORPHINE SULFATE 2 MG/ML IJ SOLN
2.0000 mg | INTRAMUSCULAR | Status: DC | PRN
  Administered 2010-12-12: 3 mg via INTRAVENOUS
  Administered 2010-12-13: 2 mg via INTRAVENOUS
  Administered 2010-12-13: 4 mg via INTRAVENOUS
  Filled 2010-12-12: qty 1
  Filled 2010-12-12 (×2): qty 2

## 2010-12-12 MED ORDER — BUPIVACAINE-EPINEPHRINE 0.25% -1:200000 IJ SOLN
INTRAMUSCULAR | Status: AC
  Filled 2010-12-12: qty 1

## 2010-12-12 MED ORDER — NEOSTIGMINE METHYLSULFATE 1 MG/ML IJ SOLN
INTRAMUSCULAR | Status: DC | PRN
  Administered 2010-12-12: 5 mg via INTRAVENOUS

## 2010-12-12 MED ORDER — FENTANYL CITRATE 0.05 MG/ML IJ SOLN
INTRAMUSCULAR | Status: DC | PRN
  Administered 2010-12-12 (×4): 50 ug via INTRAVENOUS
  Administered 2010-12-12: 100 ug via INTRAVENOUS
  Administered 2010-12-12: 50 ug via INTRAVENOUS

## 2010-12-12 MED ORDER — SUCCINYLCHOLINE CHLORIDE 20 MG/ML IJ SOLN
INTRAMUSCULAR | Status: DC | PRN
  Administered 2010-12-12: 100 mg via INTRAVENOUS

## 2010-12-12 MED ORDER — HEPARIN SODIUM (PORCINE) 5000 UNIT/ML IJ SOLN
5000.0000 [IU] | INTRAMUSCULAR | Status: AC
  Administered 2010-12-12: 5000 [IU] via SUBCUTANEOUS

## 2010-12-12 MED ORDER — UNJURY CHOCOLATE CLASSIC POWDER
2.0000 [oz_av] | Freq: Four times a day (QID) | ORAL | Status: DC
  Administered 2010-12-14 (×3): 2 [oz_av] via ORAL

## 2010-12-12 MED ORDER — DEXTROSE 5 % IV SOLN
2.0000 g | INTRAVENOUS | Status: AC
  Administered 2010-12-12: 2 g via INTRAVENOUS
  Filled 2010-12-12: qty 2

## 2010-12-12 MED ORDER — CEFOXITIN SODIUM-DEXTROSE 1-4 GM-% IV SOLR (PREMIX)
INTRAVENOUS | Status: AC
  Filled 2010-12-12: qty 100

## 2010-12-12 MED ORDER — PROPOFOL 10 MG/ML IV BOLUS
INTRAVENOUS | Status: DC | PRN
  Administered 2010-12-12: 200 mg via INTRAVENOUS

## 2010-12-12 MED ORDER — PROMETHAZINE HCL 25 MG/ML IJ SOLN
INTRAMUSCULAR | Status: AC
  Filled 2010-12-12: qty 1

## 2010-12-12 MED ORDER — GLYCOPYRROLATE 0.2 MG/ML IJ SOLN
INTRAMUSCULAR | Status: DC | PRN
  Administered 2010-12-12: .6 mg via INTRAVENOUS

## 2010-12-12 MED ORDER — OXYCODONE-ACETAMINOPHEN 5-325 MG/5ML PO SOLN
5.0000 mL | ORAL | Status: DC | PRN
  Administered 2010-12-13: 10 mL via ORAL
  Administered 2010-12-14: 5 mL via ORAL
  Administered 2010-12-14: 10 mL via ORAL
  Administered 2010-12-15: 5 mL via ORAL
  Filled 2010-12-12: qty 10
  Filled 2010-12-12 (×2): qty 5
  Filled 2010-12-12: qty 10

## 2010-12-12 MED ORDER — FENTANYL CITRATE 0.05 MG/ML IJ SOLN
INTRAMUSCULAR | Status: AC
  Filled 2010-12-12: qty 2

## 2010-12-12 MED ORDER — LACTATED RINGERS IR SOLN
Status: DC | PRN
  Administered 2010-12-12: 3000 mL
  Administered 2010-12-12: 16:00:00

## 2010-12-12 MED ORDER — LIDOCAINE HCL (CARDIAC) 20 MG/ML IV SOLN
INTRAVENOUS | Status: DC | PRN
  Administered 2010-12-12: 75 mg via INTRAVENOUS

## 2010-12-12 MED ORDER — PROMETHAZINE HCL 25 MG/ML IJ SOLN
6.2500 mg | INTRAMUSCULAR | Status: AC | PRN
  Administered 2010-12-12 (×2): 6.25 mg via INTRAVENOUS
  Administered 2010-12-12: 12.5 mg via INTRAVENOUS

## 2010-12-12 MED ORDER — ACETAMINOPHEN 160 MG/5ML PO SOLN
650.0000 mg | ORAL | Status: DC | PRN
  Administered 2010-12-15: 650 mg via ORAL
  Filled 2010-12-12: qty 20.3

## 2010-12-12 MED ORDER — ACETAMINOPHEN 10 MG/ML IV SOLN
INTRAVENOUS | Status: AC
  Filled 2010-12-12: qty 100

## 2010-12-12 MED ORDER — FENTANYL CITRATE 0.05 MG/ML IJ SOLN
25.0000 ug | INTRAMUSCULAR | Status: DC | PRN
  Administered 2010-12-12: 25 ug via INTRAVENOUS

## 2010-12-12 MED ORDER — ONDANSETRON HCL 4 MG/2ML IJ SOLN
INTRAMUSCULAR | Status: DC | PRN
  Administered 2010-12-12: 4 mg via INTRAVENOUS

## 2010-12-12 MED ORDER — FIBRIN SEALANT COMPONENT 5 ML EX KIT
PACK | CUTANEOUS | Status: DC | PRN
  Administered 2010-12-12: 10 mL

## 2010-12-12 MED ORDER — EPHEDRINE SULFATE 50 MG/ML IJ SOLN
INTRAMUSCULAR | Status: DC | PRN
  Administered 2010-12-12: 10 mg via INTRAVENOUS

## 2010-12-12 SURGICAL SUPPLY — 66 items
APL SKNCLS STERI-STRIP NONHPOA (GAUZE/BANDAGES/DRESSINGS)
APL SRG 32X5 SNPLK LF DISP (MISCELLANEOUS) ×1
APPLICATOR COTTON TIP 6IN STRL (MISCELLANEOUS) ×2 IMPLANT
BENZOIN TINCTURE PRP APPL 2/3 (GAUZE/BANDAGES/DRESSINGS) IMPLANT
BLADE SURG 15 STRL LF DISP TIS (BLADE) ×1 IMPLANT
BLADE SURG 15 STRL SS (BLADE) ×2
CABLE HIGH FREQUENCY MONO STRZ (ELECTRODE) ×1 IMPLANT
CANISTER SUCTION 2500CC (MISCELLANEOUS) ×3 IMPLANT
CLIP SUT LAPRA TY ABSORB (SUTURE) ×2 IMPLANT
CLOTH BEACON ORANGE TIMEOUT ST (SAFETY) ×2 IMPLANT
COVER SURGICAL LIGHT HANDLE (MISCELLANEOUS) ×2 IMPLANT
DEVICE SUTURE ENDOST 10MM (ENDOMECHANICALS) ×2 IMPLANT
DISSECTOR BLUNT TIP ENDO 5MM (MISCELLANEOUS) IMPLANT
DRAIN PENROSE 18X1/4 LTX STRL (WOUND CARE) ×2 IMPLANT
DRAPE CAMERA CLOSED 9X96 (DRAPES) ×2 IMPLANT
GAUZE SPONGE 4X4 16PLY XRAY LF (GAUZE/BANDAGES/DRESSINGS) ×2 IMPLANT
GLOVE BIOGEL M 8.0 STRL (GLOVE) ×2 IMPLANT
GOWN STRL NON-REIN LRG LVL3 (GOWN DISPOSABLE) ×2 IMPLANT
GOWN STRL REIN XL XLG (GOWN DISPOSABLE) ×4 IMPLANT
HANDLE STAPLE EGIA 4 XL (STAPLE) ×2 IMPLANT
HOVERMATT SINGLE USE (MISCELLANEOUS) ×2 IMPLANT
KIT BASIN OR (CUSTOM PROCEDURE TRAY) ×2 IMPLANT
KIT GASTRIC LAVAGE 34FR ADT (SET/KITS/TRAYS/PACK) ×2 IMPLANT
NDL SPNL 22GX3.5 QUINCKE BK (NEEDLE) ×1 IMPLANT
NEEDLE SPNL 22GX3.5 QUINCKE BK (NEEDLE) ×2 IMPLANT
NS IRRIG 1000ML POUR BTL (IV SOLUTION) ×2 IMPLANT
PACK CARDIOVASCULAR III (CUSTOM PROCEDURE TRAY) ×2 IMPLANT
PEN SKIN MARKING BROAD (MISCELLANEOUS) ×2 IMPLANT
RELOAD EGIA 45 MED/THCK PURPLE (STAPLE) ×2 IMPLANT
RELOAD EGIA 45 TAN VASC (STAPLE) ×2 IMPLANT
RELOAD EGIA 60 MED/THCK PURPLE (STAPLE) ×8 IMPLANT
RELOAD EGIA 60 TAN VASC (STAPLE) ×2 IMPLANT
RELOAD ENDO STITCH 2.0 (ENDOMECHANICALS) ×22
RELOAD STAPLE 60 MED/THCK ART (STAPLE) ×2 IMPLANT
RELOAD SUT SNGL STCH ABSRB 2-0 (ENDOMECHANICALS) ×5 IMPLANT
RELOAD SUT SNGL STCH BLK 2-0 (ENDOMECHANICALS) ×4 IMPLANT
SCISSORS LAP 5X35 DISP (ENDOMECHANICALS) ×2 IMPLANT
SEALANT SURGICAL APPL DUAL CAN (MISCELLANEOUS) ×2 IMPLANT
SET IRRIG TUBING LAPAROSCOPIC (IRRIGATION / IRRIGATOR) ×2 IMPLANT
SHEARS CURVED HARMONIC AC 45CM (MISCELLANEOUS) ×2 IMPLANT
SLEEVE ADV FIXATION 12X100MM (TROCAR) ×1 IMPLANT
SLEEVE ADV FIXATION 5X100MM (TROCAR) ×1 IMPLANT
SLEEVE Z-THREAD 12X100MM (TROCAR) IMPLANT
SLEEVE Z-THREAD 5X100MM (TROCAR) IMPLANT
SOLUTION ANTI FOG 6CC (MISCELLANEOUS) ×2 IMPLANT
SPONGE GAUZE 4X4 12PLY (GAUZE/BANDAGES/DRESSINGS) ×2 IMPLANT
STAPLER VISISTAT 35W (STAPLE) ×2 IMPLANT
STRIP CLOSURE SKIN 1/2X4 (GAUZE/BANDAGES/DRESSINGS) IMPLANT
SUT RELOAD ENDO STITCH 2 48X1 (ENDOMECHANICALS) ×6
SUT RELOAD ENDO STITCH 2.0 (ENDOMECHANICALS) ×5
SUT VIC AB 2-0 SH 27 (SUTURE) ×2
SUT VIC AB 2-0 SH 27X BRD (SUTURE) ×1 IMPLANT
SUT VIC AB 4-0 SH 18 (SUTURE) ×2 IMPLANT
SUTURE RELOAD END STTCH 2 48X1 (ENDOMECHANICALS) ×6 IMPLANT
SUTURE RELOAD ENDO STITCH 2.0 (ENDOMECHANICALS) ×5 IMPLANT
SYR 20CC LL (SYRINGE) ×2 IMPLANT
SYR 30ML LL (SYRINGE) ×2 IMPLANT
SYR 50ML LL SCALE MARK (SYRINGE) ×2 IMPLANT
TRAY FOLEY CATH 14FRSI W/METER (CATHETERS) ×2 IMPLANT
TROCAR ADV FIXATION 12X100MM (TROCAR) ×2 IMPLANT
TROCAR XCEL 12X100 BLDLESS (ENDOMECHANICALS) ×2 IMPLANT
TROCAR Z-THREAD FIOS 12X100MM (TROCAR) IMPLANT
TROCAR Z-THREAD FIOS 5X100MM (TROCAR) ×2 IMPLANT
TUBING CONNECTING 10 (TUBING) ×2 IMPLANT
TUBING ENDO SMARTCAP (MISCELLANEOUS) ×2 IMPLANT
TUBING FILTER THERMOFLATOR (ELECTROSURGICAL) ×2 IMPLANT

## 2010-12-12 NOTE — Op Note (Signed)
[name], Hannah Branch                 ACCOUNT NO.:  [id number]  MEDICAL RECORD NO.:  [id number]  LOCATION:  [facility]                         FACILITY:  [facility]  PHYSICIAN:  Sandria Bales. Ezzard Standing, M.D.  DATE OF BIRTH:  1950-03-27  DATE OF PROCEDURE:  12/12/2010                               OPERATIVE REPORT  PREOPERATIVE DIAGNOSIS:  Morbid obesity, status post laparoscopic Roux-en-Y gastric bypass.  POSTOPERATIVE DIAGNOSIS:  Morbid obesity, status post laparoscopic Roux-en-Y gastric bypass, normal esophagus, gastric pouch and anastomosis.  PROCEDURES:  Esophagogastroduodenoscopy.  SURGEON:  Sandria Bales. Ezzard Standing, M.D.  FIRST ASSISTANT:  None.  ANESTHESIA:  General endotracheal.  ESTIMATED BLOOD LOSS:  None.  INDICATION FOR PROCEDURE:  [name] is a 60 year old female who has undergone a laparoscopic Roux-en-Y by Dr. Luretha Murphy. I am doing an upper endoscopy to evaluate the stomach pouch and anastomosis.  PROCEDURAL NOTE:  Dr. Daphine Deutscher has clamped off the jejunum and is viewing the GJ anastomosis from laparoscopic outside the bowel.  The patient is under general anesthesia.  We passed the Olympus upper endoscope down her throat into her esophagus without difficulty.  I advanced the scope to the esophagogastric junction, which was at 40 cm.  The patient had a pouch, which was viable, there was no active bleeding.  The anastomosis between the stomach and the jejunum is at about 46 cm with a 6 cm pouch and the anastomosis was widely patent.  I insufflated air while Dr. Daphine Deutscher placed the anastomosis under water, there was no bubbling or evidence of leak.  I took photos of the anastomosis, then withdrew the scope.  The patient tolerated the procedure well.  Dr. Loraine Leriche will dictate the laparoscopic Roux-en-Y gastric bypass.   Sandria Bales. Ezzard Standing, M.D., FACS   DHN/MEDQ  D:  12/12/2010  T:  12/12/2010  Job:  [id number]  cc:   [place] Daphine Deutscher, MD [address] [place] [address]

## 2010-12-12 NOTE — Op Note (Signed)
Surgeon: Pollyann Savoy. Daphine Deutscher, MD, FACS Asst:  Ovidio Kin, MD, FACS   Anesthesia: General endotracheal Drains: None  Procedure: Laparoscopic Roux en Y gastric bypass with 40 cm BP limb and 100 cm Roux limb, antecolic, antegastric, candy cane to the left.  Closure of Peterson's defect. Upper endoscopy.   Description of Procedure:  The patient was taken to OR 1 at [facility] and given general anesthesia.  The abdomen was prepped with PCMX and draped sterilely.  A time out was performed.    The operation began by identifying the ligament of Treitz. I measured 40 cm downstream and divided the bowel with a 6 cm Covidian stapler.  I sutured a Penrose drain along the Roux limb end.  I measured a 1 meter (100 cm) Roux limb and then placed the distal bowels to the BP limb side by side and performed a stapled jejunojejunostomy. The common defect was closed from either end with 4-0 Vicryl using the Endo Stitch. The mesenteric defect was closed with a running 2-0 silk using the Endo Stitch. Tisseel was applied to the suture line.  The omentum was divided with the harmonic scalpel.  The Nathanson retractor was inserted in the left lateral segment of liver was retracted. The foregut dissection ensued.  Four applications of 6 cm Covidien purple load with the past 2 with Baxter peristrips were used to create the pouch.  This was 6 cm in length.  Hemostasis was maintained.  The Roux limb was then brought up with the candycane pointed left and a back row of sutures of 2-0 Vicryl were placed. I opened along the right side of each structure and inserted the 4.5 cm stapler to create the gastrojejunostomy. The common defect was closed from either end with 2-0 Vicryl and a second row was placed anterior to that the Ewald tube acting as a stent across the anastomosis. The Penrose drain was removed. Peterson's defect was closed with 2-0 silk.   Endoscopy was performed by Dr. Ezzard Standing.  No bleeding or bubbles were seen when the  anastomosis was submerged.  The incisions were injected with Expariel and were closed with 4-0 Vicryl and staples  The patient was taken to the recovery room in satisfactory condition.  Matt B. Daphine Deutscher, MD, FACS

## 2010-12-12 NOTE — H&P (Signed)
Valarie Merino, MD 12/06/2010 5:21 PM Signed  Subjective:   Patient ID: Hannah Branch, female DOB: Jan 31, 1950, 60 y.o. MRN: [id number]  HPIMrs. Branch has been our seminar and is interested in a Roux-en-Y gastric bypass. Her primary care doctor is Dr. Keturah Barre at [facility].she is a patient followed in the breast center and previously a breast cancer patient Dr. Mikal Plane. In April 2000 she had a right partial mastectomy with wide excision partial mastectomy.   As an adult, she has had issues with overweight. Initially  her BMI was 58. Today's weight is 302 and her BMI is 52. She has read and reviewed the consent for Roux en Y gastric bypass and she wants to proceed. Review of Systems  Constitutional: Negative.  HENT: Negative.  Eyes: Negative.  Respiratory: Negative.  Cardiovascular: Negative.  Gastrointestinal: Negative.  Genitourinary: Negative.  Musculoskeletal: Negative.  Skin: Negative.  Neurological: Negative.  Hematological: Negative.  Psychiatric/Behavioral: Negative.  No Known Allergies  Current Outpatient Prescriptions   Medication  Sig  Dispense  Refill   .  Ibuprofen-Diphenhydramine Cit (IBUPROFEN PM PO)  Take 1-2 tablets by mouth at bedtime and may repeat dose one time if needed. Sleep     .  losartan-hydrochlorothiazide (HYZAAR) 100-25 MG per tablet  Take 1 tablet by mouth every evening.      Past Medical History   Diagnosis  Date   .  Hypertension    .  OSA (obstructive sleep apnea)  10/10/10     pt unsure about cpap setting, making adjustments, pt will tell us setting day of surgery   .  Cancer  2000     breast- right, uterine, radiation tx done    Past Surgical History   Procedure  Date   .  Breast surgery  2000     lumpectomy x2 right   .  Abdominal hysterectomy  2002     complete hysterectomy     Objective:   Physical Exam  Constitutional: She is oriented to person, place, and time. She appears well-nourished.  HENT:  Head:  Normocephalic and atraumatic.  Eyes: Conjunctivae and EOM are normal. Pupils are equal, round, and reactive to light.  Neck: Neck supple.  Cardiovascular: Normal rate and regular rhythm.  Pulmonary/Chest: Effort normal and breath sounds normal.  Abdominal: Soft.  Lower abdominal incision from hysterectomy  Musculoskeletal: Normal range of motion.  Neurological: She is alert and oriented to person, place, and time.  Skin: Skin is warm and dry.  Psychiatric: She has a normal mood and affect. Her behavior is normal. Judgment and thought content normal.  obese white female in no acute distress weight 302, BMI 53, blood pressure 150/98, afebrile, heart rate 80, respirations 12, height 5 feet 3 inches, and girth 60 inches.   Assessment:    Morbid obesity with BMI 53  History of breast cancer   Plan:    Workup for laparoscopic Roux en Y gastric bypass  Since she was seen initially she has lost down to a weight of 302 which puts her BMI 53.6. She comes in today in rechecked her and assessed her physical findings and they were essentially unchanged from before. She will take oral bowel prep for her Roux-en-Y gastric bypass which is scheduled for this coming Tuesday. She's had a little upper respiratory tract infection has been treated with antibiotics per Dr. Sherral Hammers. Today her lungs sounded clear and heart sounds were sinus rhythm without murmurs or gallops.  Plan laparoscopic Roux-en-Y  gastric bypass this coming Tuesday. I have discussed the fact that with her previous history of uterine cancer and hysterectomy that there is a chance that she had really severe adhesions we would be unable to do this procedure.   There has been no change in the patient's past medical history or physical exam in the past 24 hours to the best of my knowledge.  Expectations and outcome results have been discussed with the patient to include risks and benefits.  All questions have been answered and will proceed with  previously discussed procedure noted and signed in the consent form in the patient's record.    Hannah Branch BMD @NOW  12/12/2010

## 2010-12-12 NOTE — Transfer of Care (Signed)
Immediate Anesthesia Transfer of Care Note  Patient: Hannah Branch  Procedure(s) Performed:  LAPAROSCOPIC ROUX-EN-Y GASTRIC - upper endoscopy  Patient Location: PACU  Anesthesia Type: General  Level of Consciousness: awake and alert   Airway & Oxygen Therapy: Patient Spontanous Breathing and Patient connected to face mask oxygen  Post-op Assessment: Report given to PACU RN, Post -op Vital signs reviewed and stable and Patient moving all extremities X 4  Post vital signs: Reviewed and stable  Complications: No apparent anesthesia complications

## 2010-12-12 NOTE — Anesthesia Postprocedure Evaluation (Signed)
  Anesthesia Post-op Note  Patient: Hannah Branch  Procedure(s) Performed:  LAPAROSCOPIC ROUX-EN-Y GASTRIC - upper endoscopy  Patient Location: PACU  Anesthesia Type: General  Level of Consciousness: awake and alert   Airway and Oxygen Therapy: Patient Spontanous Breathing  Post-op Pain: mild  Post-op Assessment: Post-op Vital signs reviewed, Patient's Cardiovascular Status Stable, Respiratory Function Stable, Patent Airway and No signs of Nausea or vomiting  Post-op Vital Signs: stable  Complications: No apparent anesthesia complications

## 2010-12-12 NOTE — Anesthesia Preprocedure Evaluation (Addendum)
Anesthesia Evaluation  Patient identified by MRN, date of birth, ID band Patient awake  General Assessment Comment:Prior history of R breast cancer; s/p R mastectomy. No IV or BP in RUE  Reviewed: Allergy & Precautions, H&P , NPO status , Patient's Chart, lab work & pertinent test results  History of Anesthesia Complications Negative for: history of anesthetic complications  Airway Mallampati: II TM Distance: <3 FB     Dental  (+) Teeth Intact and Dental Advisory Given   Pulmonary neg pulmonary ROS, sleep apnea and Continuous Positive Airway Pressure Ventilation ,  clear to auscultation  Pulmonary exam normal       Cardiovascular Exercise Tolerance: Poor hypertension, Pt. on medications + DOE Regular Normal    Neuro/Psych Negative Neurological ROS  Negative Psych ROS   GI/Hepatic negative GI ROS, Neg liver ROS,   Endo/Other  Negative Endocrine ROSMorbid obesity  Renal/GU negative Renal ROS  Genitourinary negative   Musculoskeletal negative musculoskeletal ROS (+)   Abdominal (+) obese,   Peds negative pediatric ROS (+)  Hematology negative hematology ROS (+)   Anesthesia Other Findings   Reproductive/Obstetrics negative OB ROS                          Anesthesia Physical Anesthesia Plan  ASA: III  Anesthesia Plan: General   Post-op Pain Management:    Induction: Intravenous  Airway Management Planned: Oral ETT  Additional Equipment:   Intra-op Plan:   Post-operative Plan: Extubation in OR  Informed Consent: I have reviewed the patients History and Physical, chart, labs and discussed the procedure including the risks, benefits and alternatives for the proposed anesthesia with the patient or authorized representative who has indicated his/her understanding and acceptance.   Dental advisory given  Plan Discussed with:   Anesthesia Plan Comments:         Anesthesia Quick  Evaluation

## 2010-12-13 ENCOUNTER — Inpatient Hospital Stay (HOSPITAL_COMMUNITY): Payer: Managed Care, Other (non HMO)

## 2010-12-13 DIAGNOSIS — Z48812 Encounter for surgical aftercare following surgery on the circulatory system: Secondary | ICD-10-CM

## 2010-12-13 LAB — CBC
Hemoglobin: 12.1 g/dL (ref 12.0–15.0)
MCH: 27.9 pg (ref 26.0–34.0)
MCHC: 31.7 g/dL (ref 30.0–36.0)
MCV: 88.2 fL (ref 78.0–100.0)
RBC: 4.33 MIL/uL (ref 3.87–5.11)

## 2010-12-13 MED ORDER — KCL IN DEXTROSE-NACL 20-5-0.45 MEQ/L-%-% IV SOLN
INTRAVENOUS | Status: DC
  Administered 2010-12-13 – 2010-12-15 (×6): via INTRAVENOUS
  Filled 2010-12-13 (×11): qty 1000

## 2010-12-13 MED ORDER — IOHEXOL 300 MG/ML  SOLN
40.0000 mL | Freq: Once | INTRAMUSCULAR | Status: AC | PRN
  Administered 2010-12-13: 40 mL via ORAL

## 2010-12-13 NOTE — Progress Notes (Signed)
Pt sleepy but alert and oriented; VSS; denies nausea or vomiting; denies passing gas; voiding on bedside commode without difficulty; encouraged ambulation today and pt verb understanding of; pain relieved by prn meds; doppler study complete and negative for DVT; awaiting swallow study; discussed plan of care; discharge instructions given to pt to review and will complete tomorrow.  GASTRIC BYPASS/SLEEVE DISCHARGE INSTRUCTIONS  Drs. Fredrik Rigger, Hoxworth, Wilson, and Florence Call if you have any problems.   Call [phone] and ask for the surgeon on call.    If you need immediate assistance come to the ER at [facility]. Tell the ER personnel that you are a new post-op gastric bypass patient. Signs and symptoms to report:   Severe vomiting or nausea. If you cannot tolerate clear liquids for longer than 1 day, you need to call your surgeon.    Abdominal pain which does not get better after taking your pain medication   Fever greater than 101 F degree   Difficulty breathing   Chest pain    Redness, swelling, drainage, or foul odor at incision sites    If your incisions open or pull apart   Swelling or pain in calf (lower leg)   Diarrhea, frequent watery, uncontrolled bowel movements.   Constipation, (no bowel movements for 3 days) if this occurs, Take Milk of Magnesia, 2 tablespoons by mouth, 3 times a day for 2 days if needed.  Call your doctor if constipation continues. Stop taking Milk of Magnesia once you have had a bowel movement. You may also use Miralax according to the label instructions.   Anything you consider "abnormal for you".   Normal side effects after Surgery:   Unable to sleep at night or concentrate   Irritability   Being tearful (crying) or depressed   These are common complaints, possibly related to your anesthesia, stress of surgery and change in lifestyle, that usually go away a few weeks after surgery.  If these feelings continue, call your medical doctor.  Wound  Care You may have surgical glue, steri-strips, or staples over your incisions after surgery.  Surgical glue:  Looks like a clear film over your incisions and will wear off gradually. Steri-strips: Strips of tape over your incisions. You may notice a yellowish color on the skin underneath the steri-strips. This is a substance used to make the steri-strips stick better. Do not pull the steri-strips off - let them fall off.  Staples: Cherlynn Polo may be removed before you leave the hospital. If you go home with staples, call [facility] [phone]) for an appointment with your surgeon's nurse to have staples removed in 7 - 10 days. Showering: You may shower two days after your surgery unless otherwise instructed by your surgeon. Wash gently around wounds with warm soapy water, rinse well, and gently pat dry.  If you have a drain, you may need someone to hold this while you shower. Avoid tub baths until staples are removed and incisions are healed.    Medications   Medications should be liquid or crushed if larger than the size of a dime.  Extended release pills should not be crushed.   Depending on the size and number of medications you take, you may need to stagger/change the time you take your medications so that you do not over-fill your pouch.    Make sure you follow-up with your primary care physician to make medication adjustments needed during rapid weight loss and life-style adjustment.   If you are diabetic,  follow up with the doctor that prescribes your diabetes medication(s) within one week after surgery and check your blood sugar regularly.   Do not drive while taking narcotics!   Do not take acetaminophen (Tylenol) and Roxicet or Lortab Elixir at the same time since these pain medications contain acetaminophen.  Diet at home: (First 2 Weeks) You will see the nutritionist two weeks after your surgery. She will advance your diet if you are tolerating liquids well. Once at home,  if you have severe vomiting or nausea and cannot tolerate clear liquids lasting longer than 1 day, call your surgeon.  Begin high protein shake 2 ounces every 3 hours, 5 - 6 times per day.  Gradually increase the amount you drink as tolerated.  You may find it easier to slowly sip shakes throughout the day.  It is important to get your proteins in first.   Protein Shake   Drink at least 2 ounces of shake 5-6 times per day   Each serving of protein shakes should have a minimum of 15 grams of protein and no more than 5 grams of carbohydrate    Increase the amount of protein shake you drink as tolerated   Protein powder may be added to fluids such as non-fat milk or Lactaid milk (limit to 20 grams added protein powder per serving   The initial goal is to drink at least 8 ounces of protein shake/drink per day (or as directed by the nutritionist). Some examples of protein shakes are [employer], [employer]illard[employer], EAS Edge HP, and Unjury. Hydration   Gradually increase the amount of water and other liquids as tolerated (See Acceptable Fluids)   Gradually increase the amount of protein shake as tolerated     Sip fluids slowly and throughout the day   May use Sugar substitutes, use sparingly (limit to 6 - 8 packets per day). Your fluid goal is 64 ounces of fluid daily. It may take a few weeks to build up to this.         32 oz (or more) should be clear liquids and 32 oz (or more) should be full liquids.         Liquids should not contain sugar, caffeine, or carbonation! Acceptable Fluids Clear Liquids:   Water or Sugar-free flavored water, Fruit H2O   Decaffeinated coffee or tea (sugar-free)   Crystal Lite, Wyler's Lite, Minute Maid Lite   Sugar-free Jell-O   Bouillon or broth   Sugar-free Popsicle:   *Less than 20 calories each; Limit 1 per day   Full Liquids:              Protein Shakes/Drinks + 2 choices per day of other full liquids shown below.    Other full liquids must be: No more than  12 grams of Carbs per serving,  No more than 3 grams of Fat per serving   Strained low-fat cream soup   Non-Fat milk   Fat-free Lactaid Milk   Sugar-free yogurt (Dannon Lite & Fit) Vitamins and Minerals (Start 1 day after surgery unless otherwise directed)   2 Chewable Multivitamin / Multimineral Supplement (i.e. Centrum for Adults)   Chewable Calcium Citrate with Vitamin D-3. Take 1500 mg each day.           (Example: 3 Chewable Calcium Plus 600 with Vitamin D-3 can be found at [facility])         Vitamin B-12, 350 - 500 micrograms (oral tablet) each day   Do not  mix multivitamins containing iron with calcium supplements; take 2 hours   apart   Do not substitute Tums (calcium carbonate) for your calcium   Menstruating women and those at risk for anemia may need extra iron. Talk with your doctor to see if you need additional iron.    If you need extra iron:  Total daily Iron recommendations (including Vitamins) = 50 - 100 mg Iron/day Do not stop taking or change any vitamins or minerals until you talk to your nutritionist or surgeon. Your nutritionist and / or physician must approve all vitamin and mineral supplements. Exercise For maximum success, begin exercising as soon as your doctor recommends. Make sure your physician approves any physical activity.   Depending on fitness level, begin with a simple walking program   Walk 5-15 minutes each day, 7 days per week.    Slowly increase until you are walking 30-45 minutes per day   Consider joining our BELT program. [phone] or email belt@[employer] .edu Things to remember:    You may have sexual relations when you feel comfortable. It is VERY important for female patients to use a reliable birth control method. Fertility often increases after surgery. Do not get pregnant for at least 18 months.   It is very important to keep all follow up appointments with your surgeon, nutritionist, primary care physician, and behavioral health practitioner. After  the first year, please follow up with your bariatric surgeon at least once a year in order to maintain best weight loss results.  [facility]: [phone] [facility]: [phone]   Free counseling is available for you and your family through collaboration between [facility] and [place]. Please call [phone] and leave a message.    Consider purchasing a medical alert bracelet that says you had gastric bypass surgery.    [facility] has a free Bariatric Surgery Support Group that meets monthly, the 3rd Thursday, 6 pm, Classroom #1, [place]. You may register online at [email or web address], but registration is not necessary. Select Classes and Support Groups, Bariatric Surgery, or Call [phone]   Do not return to work or drive until cleared by your surgeon   Use your CPAP when sleeping if applicable   Do not lift anything greater than ten pounds for at least two weeks  Talmadge Chad, RN Bariatric Nurse Coordinator

## 2010-12-13 NOTE — Progress Notes (Signed)
Per Dr. Daphine Deutscher, ok to start pt on POD1 diet. Will re-educate pt prior to providing her with water/ice chips to make sure she has a full understanding of diet regimen.

## 2010-12-13 NOTE — Progress Notes (Signed)
Patient is alert and oriented, vital signs are stable, pt with no complaints of pain or discomfort, pt with  nausea no vomiting medicated with 4 mg of zofran , bowel sounds to abdomen are hypoactive, dressings are clean dry and intact, pt up and ambulating with 1 assist currently twice today, urine output is adequate, will continue to monitor [employer] RN

## 2010-12-13 NOTE — Progress Notes (Signed)
Patient ID: Hannah Branch, female   DOB: 01-Sep-1950, 60 y.o.   MRN: [id number] [facility] Progress Note:   1 Day Post-Op  Subjective: Minimal complaints of pain.  No nausea or vomiting Objective: Vital signs in last 24 hours: Temp:  [96.8 F (36 C)-98.6 F (37 C)] 98.2 F (36.8 C) (12/05 1800) Pulse Rate:  [80-97] 81  (12/05 1800) Resp:  [18-20] 20  (12/05 1800) BP: (120-178)/(74-84) 129/78 mmHg (12/05 1800) SpO2:  [95 %-100 %] 95 % (12/05 1800)  Intake/Output from previous day: 12/04 0701 - 12/05 0700 In: 4537 [I.V.:4387; IV Piggyback:150] Out: 715 [Urine:675; Blood:40] Intake/Output this shift:    Physical Exam:  Dressings dry. No abdominal pain Lab Results:   Basename 12/13/10 0103 12/12/10 1800  WBC 7.8 --  HGB 12.1 12.4  HCT 38.2 38.3  PLT 125* --   BMET  Basename 12/12/10 1951  NA --  K --  CL --  CO2 --  GLUCOSE --  BUN --  CREATININE 0.80  CALCIUM --   PT/INR No results found for this basename: LABPROT:2,INR:2 in the last 72 hours Studies/Results: Dg Ugi W/water Sol Cm  12/13/2010  *RADIOLOGY REPORT*  Clinical Data:1 day post gastric bypass.  WATER SOLUBLE UPPER GI SERIES  Technique:  Single-column upper GI series was performed using water soluble contrast.  Fluoroscopy Time: 0.55 minutes  Contrast: 40 ml Omnipaque-300.  Comparison: 09/28/2010.  Findings: No holdup to the flow of ingested contrast.  No leak identified.  The patient was nauseous and not able to ingest more than 40 ml of contrast.  Delayed imaging with slow transit time with distal anastomoses not adequately assessed.  IMPRESSION: No leak identified.  Distal anastomoses not visualized.  Original Report Authenticated By: Fuller Canada, M.D.   Anti-infectives: Anti-infectives     Start     Dose/Rate Route Frequency Ordered Stop   12/12/10 1000   cefOXitin (MEFOXIN) 2 g in dextrose 5 % 50 mL IVPB        2 g 100 mL/hr over 30 Minutes Intravenous 60 min pre-op 12/12/10 4098  12/12/10 1202          Assessment/Plan: Problem List: Patient Active Problem List  Diagnoses  . OSA (obstructive sleep apnea)    Swallow OK.  Advance to PD 1 diet.  1 Day Post-Op    LOS: 1 day   Matt B. Daphine Deutscher, MD,  [facility] [phone] beeper [phone]  12/13/2010 7:40 PM

## 2010-12-13 NOTE — Progress Notes (Signed)
PRELIMINARY  PRELIMINARY  PRELIMINARY  PRELIMINARY  Bilateral lower extremity venous duplex completed.    Preliminary report:  Bilateral:  No evidence of acute DVT or Baker's Cyst.  Thickened walls noted in bilateral popliteal veins.   Terance Hart, RVT 12/13/2010 8:57 AM

## 2010-12-14 ENCOUNTER — Encounter (HOSPITAL_COMMUNITY): Payer: Self-pay | Admitting: Surgery

## 2010-12-14 LAB — CBC
HCT: 37.9 % (ref 36.0–46.0)
MCH: 29.2 pg (ref 26.0–34.0)
MCHC: 31.9 g/dL (ref 30.0–36.0)
MCV: 91.3 fL (ref 78.0–100.0)
Platelets: 96 10*3/uL — ABNORMAL LOW (ref 150–400)
RDW: 15.2 % (ref 11.5–15.5)
WBC: 9.8 10*3/uL (ref 4.0–10.5)

## 2010-12-14 LAB — DIFFERENTIAL
Basophils Relative: 0 % (ref 0–1)
Eosinophils Relative: 1 % (ref 0–5)
Lymphs Abs: 0.6 10*3/uL — ABNORMAL LOW (ref 0.7–4.0)
Monocytes Relative: 11 % (ref 3–12)

## 2010-12-14 NOTE — Progress Notes (Signed)
Pt alert and oriented; VSS; pt states she is feeling much better today; denies nausea or vomiting; tolerating protein shakes well; ambulating and voiding without difficulty; denies passing gas at this point; enc ambulation and incentive spirometer every hour while awake and pt verbalized understanding of; has seen Dr. Daphine Deutscher and plan is to stay until tomorrow; discharge instructions reviewed and pt verbalized understanding of; pt already has follow up appt with [facility] and [facility]. Talmadge Chad, RN Bariatric Nurse Coordinator

## 2010-12-14 NOTE — Progress Notes (Signed)
Patient ID: Hannah Branch, female   DOB: June 17, 1950, 60 y.o.   MRN: [id number] [facility] Progress Note:   2 Days Post-Op  Subjective: Tolerating liquids thus far.  Not ready for discharge yet Objective: Vital signs in last 24 hours: Temp:  [96.8 F (36 C)-99.5 F (37.5 C)] 99.5 F (37.5 C) (12/06 0545) Pulse Rate:  [81-101] 101  (12/06 0545) Resp:  [20] 20  (12/06 0545) BP: (123-172)/(75-92) 145/89 mmHg (12/06 0545) SpO2:  [94 %-100 %] 95 % (12/06 0545)  Intake/Output from previous day: 12/05 0701 - 12/06 0700 In: 3762.4 [P.O.:180; I.V.:3582.4] Out: 1100 [Urine:1100] Intake/Output this shift:    Physical Exam:  Incisions dry Lab Results:   [facility] 12/14/10 0540 12/13/10 0103  WBC 9.8 7.8  HGB 12.1 12.1  HCT 37.9 38.2  PLT 96* 125*   BMET  Basename 12/12/10 1951  NA --  K --  CL --  CO2 --  GLUCOSE --  BUN --  CREATININE 0.80  CALCIUM --   PT/INR No results found for this basename: LABPROT:2,INR:2 in the last 72 hours Studies/Results: Dg Ugi W/water Sol Cm  12/13/2010  *RADIOLOGY REPORT*  Clinical Data:1 day post gastric bypass.  WATER SOLUBLE UPPER GI SERIES  Technique:  Single-column upper GI series was performed using water soluble contrast.  Fluoroscopy Time: 0.55 minutes  Contrast: 40 ml Omnipaque-300.  Comparison: 09/28/2010.  Findings: No holdup to the flow of ingested contrast.  No leak identified.  The patient was nauseous and not able to ingest more than 40 ml of contrast.  Delayed imaging with slow transit time with distal anastomoses not adequately assessed.  IMPRESSION: No leak identified.  Distal anastomoses not visualized.  Original Report Authenticated By: Fuller Canada, M.D.   Anti-infectives: Anti-infectives     Start     Dose/Rate Route Frequency Ordered Stop   12/12/10 1000   cefOXitin (MEFOXIN) 2 g in dextrose 5 % 50 mL IVPB        2 g 100 mL/hr over 30 Minutes Intravenous 60 min pre-op 12/12/10 4098 12/12/10 1202           Assessment/Plan: Problem List: Patient Active Problem List  Diagnoses  . OSA (obstructive sleep apnea)    Doing well.  Not ready for discharge.  Advance to PD 2 bypass diet 2 Days Post-Op    LOS: 2 days   Matt B. Daphine Deutscher, MD, [facility] [phone] beeper [phone]  12/14/2010 8:55 AM

## 2010-12-15 MED ORDER — OXYCODONE-ACETAMINOPHEN 5-325 MG/5ML PO SOLN: 5.0000 mL | ORAL | Status: AC | PRN

## 2010-12-15 NOTE — Discharge Summary (Signed)
Physician Discharge Summary  Patient ID: Hannah Branch MRN: [id number] DOB/AGE: [birth year] 61 y.o.  Admit date: 12/12/2010 Discharge date: 12/15/2010  Admission Diagnoses:  Discharge Diagnoses:  Active Problems:  * No active hospital problems. *    Discharged Condition: good  Hospital Course: The patient underwent surgey.  She had a UGI on PD one and was started on liquids.  She increased her activity and is ready to go home today.   Consults: none  Significant Diagnostic Studies: radiology: UGI showed no leaks  Treatments: surgery: lap Roux en Y gastric bypass  Discharge Exam: Blood pressure 132/84, pulse 89, temperature 97.9 F (36.6 C), temperature source Oral, resp. rate 24, height 5\' 3"  (1.6 m), weight 314 lb 1.6 oz (142.475 kg), SpO2 97.00%. Incisons OK.  Staples out and steristrips applied  Disposition: Home or Self Care  Discharge Orders    Future Appointments: Provider: Department: Dept Phone: Center:   12/27/2010 9:10 AM Valarie Merino, MD [facility] [phone] None   12/27/2010 11:00 AM Royal Hawthorn, RD, LDN, MS [facility] [phone] [facility]     Future Orders Please Complete By Expires   Discharge patient        Current Discharge Medication List    START taking these medications   Details  oxyCODONE-acetaminophen (ROXICET) 5-325 MG/5ML solution Take 5 mLs by mouth every 4 (four) hours as needed for pain. Qty: 200 mL, Refills: 0      CONTINUE these medications which have NOT CHANGED   Details  losartan-hydrochlorothiazide (HYZAAR) 100-25 MG per tablet Take 1 tablet by mouth every evening.       STOP taking these medications     Ibuprofen-Diphenhydramine Cit (IBUPROFEN PM PO)        Follow-up Information    Follow up with Alyson Ki B, MD in 2 weeks. (see prior appt made with Dr. Daphine Deutscher)    Contact information:   [facility] [address] [place] [address] [phone]         Signed: Valarie Merino 12/15/2010, 8:15 AM

## 2010-12-15 NOTE — Progress Notes (Signed)
Patient ID: Hannah Branch, female   DOB: Nov 17, 1950, 60 y.o.   MRN: [id number] [facility] Progress Note:   3 Days Post-Op  Subjective: Doing well.  No complaints.  Ready for discharge Objective: Vital signs in last 24 hours: Temp:  [97.9 F (36.6 C)-99 F (37.2 C)] 97.9 F (36.6 C) (12/07 0605) Pulse Rate:  [89-98] 89  (12/07 0605) Resp:  [20-24] 24  (12/07 0605) BP: (132-149)/(72-84) 132/84 mmHg (12/07 0605) SpO2:  [94 %-99 %] 97 % (12/07 0605)  Intake/Output from previous day: 12/06 0701 - 12/07 0700 In: 2435.4 [I.V.:2435.4] Out: 1400 [Urine:1400] Intake/Output this shift:    Physical Exam:  Staples in incisions.  Will remove prior to discharge.   Lab Results:   Basename 12/14/10 0540 12/13/10 0103  WBC 9.8 7.8  HGB 12.1 12.1  HCT 37.9 38.2  PLT 96* 125*   BMET  Basename 12/12/10 1951  NA --  K --  CL --  CO2 --  GLUCOSE --  BUN --  CREATININE 0.80  CALCIUM --   PT/INR No results found for this basename: LABPROT:2,INR:2 in the last 72 hours Studies/Results: Dg Ugi W/water Sol Cm  12/13/2010  *RADIOLOGY REPORT*  Clinical Data:1 day post gastric bypass.  WATER SOLUBLE UPPER GI SERIES  Technique:  Single-column upper GI series was performed using water soluble contrast.  Fluoroscopy Time: 0.55 minutes  Contrast: 40 ml Omnipaque-300.  Comparison: 09/28/2010.  Findings: No holdup to the flow of ingested contrast.  No leak identified.  The patient was nauseous and not able to ingest more than 40 ml of contrast.  Delayed imaging with slow transit time with distal anastomoses not adequately assessed.  IMPRESSION: No leak identified.  Distal anastomoses not visualized.  Original Report Authenticated By: Fuller Canada, M.D.   Anti-infectives: Anti-infectives     Start     Dose/Rate Route Frequency Ordered Stop   12/12/10 1000   cefOXitin (MEFOXIN) 2 g in dextrose 5 % 50 mL IVPB        2 g 100 mL/hr over 30 Minutes Intravenous 60 min pre-op 12/12/10 2956  12/12/10 1202          Assessment/Plan: Problem List: Patient Active Problem List  Diagnoses  . OSA (obstructive sleep apnea)    Doing well and on track.  Discharge today 3 Days Post-Op    LOS: 3 days   Matt B. Daphine Deutscher, MD, [facility] [phone] beeper [phone]  12/15/2010 8:07 AM

## 2010-12-19 ENCOUNTER — Other Ambulatory Visit (INDEPENDENT_AMBULATORY_CARE_PROVIDER_SITE_OTHER): Payer: Self-pay

## 2010-12-19 ENCOUNTER — Telehealth (INDEPENDENT_AMBULATORY_CARE_PROVIDER_SITE_OTHER): Payer: Self-pay

## 2010-12-19 DIAGNOSIS — R110 Nausea: Secondary | ICD-10-CM

## 2010-12-19 MED ORDER — PROMETHAZINE HCL 25 MG RE SUPP: 25.0000 mg | Freq: Four times a day (QID) | RECTAL | Status: AC | PRN

## 2010-12-19 NOTE — Telephone Encounter (Signed)
Patient called in saying she has nausea but no vomiting. Paged Dr Daphine Deutscher and he prescribed phenergan supp and I called it into [facility] in [place].

## 2010-12-20 ENCOUNTER — Telehealth (INDEPENDENT_AMBULATORY_CARE_PROVIDER_SITE_OTHER): Payer: Self-pay | Admitting: General Surgery

## 2010-12-20 NOTE — Telephone Encounter (Signed)
Patient states she went to pick up phenergan rx and she refuses to use the suppositories so she did not get them. She is requesting phenergan tablets. Please advise.

## 2010-12-27 ENCOUNTER — Ambulatory Visit (INDEPENDENT_AMBULATORY_CARE_PROVIDER_SITE_OTHER): Payer: Managed Care, Other (non HMO) | Admitting: Surgery

## 2010-12-27 ENCOUNTER — Encounter: Payer: Self-pay | Admitting: *Deleted

## 2010-12-27 ENCOUNTER — Encounter: Payer: Managed Care, Other (non HMO) | Attending: Surgery | Admitting: *Deleted

## 2010-12-27 DIAGNOSIS — Z01818 Encounter for other preprocedural examination: Secondary | ICD-10-CM | POA: Insufficient documentation

## 2010-12-27 DIAGNOSIS — Z713 Dietary counseling and surveillance: Secondary | ICD-10-CM | POA: Insufficient documentation

## 2010-12-27 NOTE — Progress Notes (Signed)
  Bariatric Class:  Appt start time: 1100 end time:  1130.  2 Week Post-Operative Nutrition Visit  Patient was seen on 12/27/2010 for Post[facility]ative [facility]   Surgery date: 12/12/10  Surgery type: Gastric Bypass   Weight today: 283.2 lbs Weight change: 39.2 lbs Total weight lost: 38.1 lbs total at [facility] BMI: 50.4%  The following the learning objective met the patient during this course:   Identifies Phase 3A (Soft, High Proteins) Dietary Goals and will begin from 2 weeks post-operatively to 2 months post-operatively   Identifies appropriate sources of fluids and proteins   States protein recommendations and appropriate sources post-operatively  Identifies the need for appropriate texture modifications, mastication, and bite sizes when consuming solids  Identifies appropriate multivitamin and calcium sources post-operatively  Describes the need for physical activity post-operatively and will follow MD recommendations  States when to call healthcare provider regarding medication questions or post-operative complications  Handouts given during class include:  Phase 3A: Soft, High Protein Diet Handout  Band Fill Guidelines Handout  Follow-Up Plan: Patient will follow-up at [facility] in 6 weeks for 2 months post-op nutrition visit for diet advancement per MD.

## 2010-12-27 NOTE — Patient Instructions (Signed)
Follow Phase 3A-Soft, High Protein Diet and follow-up [facility] in 6 weeks for 2 months post-op nutrition visit for diet advancement.

## 2010-12-28 ENCOUNTER — Encounter: Payer: Self-pay | Admitting: Pulmonary Disease

## 2010-12-28 ENCOUNTER — Telehealth: Payer: Self-pay | Admitting: Pulmonary Disease

## 2010-12-28 NOTE — Telephone Encounter (Signed)
ONO with CPAP and RA 12/10/10>>Test time 3 hr 50 min.  Mean SpO2 87.9%, low SpO2 71%, Spent 2 hrs (52.4%) with SpO2 < 89%.  Discussed results with patient.  She would like to see how she does with weight loss after bariatric surgery.  Will continue CPAP and re-assess at next visit.

## 2011-01-10 ENCOUNTER — Telehealth: Payer: Self-pay | Admitting: *Deleted

## 2011-01-10 NOTE — Telephone Encounter (Signed)
Hannah Branch calls today to change her appointment for nutrition follow-up to a more convenient day.  She also notes that she continues to have moderate-severe nausea with vomiting. She notes that this inhibits her intake and she is not meeting protein goals. She reports low energy levels likely due to this. She notes that no particular foods cause symptoms and she is alternating food and fluid. Recommended she follow up with [facility] or Dr. Daphine Deutscher for further instructions.

## 2011-01-31 ENCOUNTER — Encounter: Payer: Self-pay | Admitting: Pulmonary Disease

## 2011-02-22 ENCOUNTER — Encounter: Payer: Self-pay | Admitting: *Deleted

## 2011-02-22 ENCOUNTER — Encounter (INDEPENDENT_AMBULATORY_CARE_PROVIDER_SITE_OTHER): Payer: Self-pay | Admitting: General Surgery

## 2011-02-22 ENCOUNTER — Encounter: Payer: Managed Care, Other (non HMO) | Attending: Surgery | Admitting: *Deleted

## 2011-02-22 ENCOUNTER — Encounter (INDEPENDENT_AMBULATORY_CARE_PROVIDER_SITE_OTHER): Payer: Self-pay | Admitting: Surgery

## 2011-02-22 ENCOUNTER — Ambulatory Visit (INDEPENDENT_AMBULATORY_CARE_PROVIDER_SITE_OTHER): Payer: Managed Care, Other (non HMO) | Admitting: Surgery

## 2011-02-22 VITALS — BP 112/74 | HR 70 | Temp 97.6°F | Resp 18 | Ht 63.0 in | Wt 267.8 lb

## 2011-02-22 DIAGNOSIS — Z713 Dietary counseling and surveillance: Secondary | ICD-10-CM | POA: Insufficient documentation

## 2011-02-22 DIAGNOSIS — Z01818 Encounter for other preprocedural examination: Secondary | ICD-10-CM | POA: Insufficient documentation

## 2011-02-22 DIAGNOSIS — E669 Obesity, unspecified: Secondary | ICD-10-CM | POA: Insufficient documentation

## 2011-02-22 NOTE — Patient Instructions (Signed)
Goals:  Follow Phase 3B: High Protein + Non-Starchy Vegetables  Eat 3-6 small meals/snacks, every 3-5 hrs  Increase lean protein foods to meet 60-80g goal  Increase fluid intake to 64oz +  Avoid drinking 15 minutes before, during and 30 minutes after eating  Aim for >30 min of physical activity daily 

## 2011-02-22 NOTE — Progress Notes (Signed)
  Follow-up visit: 8 Weeks Post-Operative Gastric Bypass Surgery  Medical Nutrition Therapy:  Appt start time: 1130 end time:  1200.  Assessment:  Primary concerns today: post-operative bariatric surgery nutrition management.  Surgery date: 12/12/10  Surgery type: Gastric Bypass   Weight today: 267.2 lbs  Weight change: 16 lbs  Total weight lost: 54.1 lbs total at [facility]  BMI: 47.7%  24-hr recall:  B (7-8 AM): Protein Shake Snk (AM): 3 reduced fat crackers w/ pb     L (PM): Bowl of soup (4 oz) Snk (PM): Protein shake  D (PM): Bowl of soup (4 oz) OR 2 oz salmon w/ 1/4 cup vegetable (non-starchy) Snk (PM): 1 small orange  Fluid intake: water, protein shake, Crystal Light = 48-70 oz Estimated total protein intake: 60-70g  Medications: Only on Hyzaar for BP Supplementation: MVI, B12, Ca+ Citrate  Using straws: No Drinking while eating: No Hair loss: No Carbonated beverages: None reported N/V/D/C: Nausea and vomiting (sporatically) Dumping syndrome: No  Recent physical activity:  Increased ADL's; No structured activity  Progress Towards Goal(s):  In progress.   Nutritional Diagnosis:  [place]-3.3 Overweight/obesity As related to past poor dietary habits and physical inactivity.  As evidenced by pt w/ recent Gastric Bypass surgery, following dietary guidelines for continued weight loss.    Intervention:  Nutrition education.  Monitoring/Evaluation:  Dietary intake, exercise, lap band fills, and body weight. Follow up in 1-2 months for 3-4 month post-op visit.

## 2011-02-22 NOTE — Progress Notes (Signed)
Hannah Branch 62 y.o.  Body mass index is 47.44 kg/(m^2).  Patient Active Problem List  Diagnoses  . OSA (obstructive sleep apnea)    No Known Allergies  Past Surgical History  Procedure Date  . Breast surgery 2000    lumpectomy x2 right  . Abdominal hysterectomy 2002    complete hysterectomy  . 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  She has had a little nausea but in general this is not too bad. Otherwise she is doing great. She has lost about 35 pounds so far. I'll see her again in 2 months in followup. Matt B. Daphine Deutscher, MD, [facility] [phone] beeper [phone]  02/22/2011 11:10 AM

## 2011-02-23 ENCOUNTER — Ambulatory Visit (INDEPENDENT_AMBULATORY_CARE_PROVIDER_SITE_OTHER): Payer: Managed Care, Other (non HMO) | Admitting: Surgery

## 2011-03-05 ENCOUNTER — Encounter (HOSPITAL_COMMUNITY): Payer: Self-pay

## 2011-04-20 ENCOUNTER — Ambulatory Visit (INDEPENDENT_AMBULATORY_CARE_PROVIDER_SITE_OTHER): Payer: Managed Care, Other (non HMO) | Admitting: Surgery

## 2011-04-20 ENCOUNTER — Encounter (INDEPENDENT_AMBULATORY_CARE_PROVIDER_SITE_OTHER): Payer: Self-pay | Admitting: Surgery

## 2011-04-20 ENCOUNTER — Other Ambulatory Visit (INDEPENDENT_AMBULATORY_CARE_PROVIDER_SITE_OTHER): Payer: Self-pay | Admitting: General Surgery

## 2011-04-20 VITALS — BP 118/72 | HR 84 | Temp 97.8°F | Resp 18 | Ht 63.0 in | Wt 248.5 lb

## 2011-04-20 DIAGNOSIS — Z9884 Bariatric surgery status: Secondary | ICD-10-CM

## 2011-04-20 NOTE — Progress Notes (Signed)
Hannah Branch is 4 months out from a Roux-en-Y gastric bypass. She lost 78 pounds or 37% of her excess weight. Today's BMI is 44.she is doing very well. I will see her again in 4 months and will check her vitamin levels at that time.

## 2011-04-24 ENCOUNTER — Ambulatory Visit: Payer: Managed Care, Other (non HMO) | Admitting: *Deleted

## 2011-04-26 ENCOUNTER — Encounter: Payer: Managed Care, Other (non HMO) | Attending: Surgery | Admitting: *Deleted

## 2011-04-26 VITALS — Ht 63.0 in | Wt 244.2 lb

## 2011-04-26 DIAGNOSIS — Z01818 Encounter for other preprocedural examination: Secondary | ICD-10-CM | POA: Insufficient documentation

## 2011-04-26 DIAGNOSIS — Z713 Dietary counseling and surveillance: Secondary | ICD-10-CM | POA: Insufficient documentation

## 2011-04-26 DIAGNOSIS — E669 Obesity, unspecified: Secondary | ICD-10-CM

## 2011-04-26 DIAGNOSIS — Z9884 Bariatric surgery status: Secondary | ICD-10-CM

## 2011-04-26 NOTE — Progress Notes (Signed)
  Follow-up visit:  16 Weeks Post-Operative Gastric Bypass Surgery  Medical Nutrition Therapy:  Appt start time: 1130   End time: 1200.  Primary concerns today:  12 Month Post-Operative Bariatric Surgery Nutrition Management.  Surgery date: 12/12/10  Surgery type: Gastric Bypass   Weight today: 244.2 lbs  Weight change: 23 lbs  Total weight lost: 77.1 lbs (total at [facility])  BMI: 43.2% Goal weight: 160 lbs % Goal met: 48%  24-hr recall: B (7-8 AM): Unjury Shake w/ 1% milk Snk (AM): 1/2 banana or 1/4 c strawberries or boiled egg    L (PM): Bowl of beef stew w/ veggies (4 oz) or 3 oz roast beef Snk (PM): Tangelo (1) D (PM): 3-4 oz protein, small salad w/ FF and ranch Snk (PM): n/a  Fluid intake: Water, protein shake, Crystal Light = 45-50 oz Estimated total protein intake: 70-90g  Medications: Only on Hyzaar for BP Supplementation: MVI, B12, Ca+ Citrate  Using straws: No Drinking while eating: No Hair loss: No Carbonated beverages: None reported N/V/D/C: Nausea (1 time/wk); small amount of diarrhea (2-3 times/wk) Dumping syndrome: No  Recent physical activity:  Increased ADLs; No structured activity. Tries to walk as much as possible during the day.   Progress Towards Goal(s):  In progress.   Nutritional Diagnosis:  [place]-3.3 Overweight/obesity related to past poor dietary habits and physical inactivity as evidenced by pt w/ recent Gastric Bypass surgery following dietary guidelines for continued weight loss.    Intervention:  Nutrition education.  Samples Dispensed:   Celebrate Vitamins MVI (Grape): 1 ea Lot #   Monitoring/Evaluation:  Dietary intake, exercise, and body weight. Follow up in 6 months for 18 month post-op visit or prn.

## 2011-04-30 ENCOUNTER — Encounter: Payer: Self-pay | Admitting: *Deleted

## 2011-04-30 NOTE — Patient Instructions (Addendum)
Goals:  Follow Phase 3B: High Protein + Non-Starchy Vegetables  Eat 3-6 small meals/snacks, every 3-5 hrs  Continue goal of lean protein foods to meet 60-80g goal  Increase fluid intake to 64oz +  Avoid drinking 15 minutes before, during and 30 minutes after eating  Aim for >30 min of physical activity daily  Follow up in 6 months or as needed.

## 2011-06-19 ENCOUNTER — Other Ambulatory Visit: Payer: Self-pay | Admitting: Family Medicine

## 2011-06-19 DIAGNOSIS — Z1231 Encounter for screening mammogram for malignant neoplasm of breast: Secondary | ICD-10-CM

## 2011-09-07 ENCOUNTER — Encounter: Payer: Managed Care, Other (non HMO) | Attending: Family Medicine | Admitting: *Deleted

## 2011-09-07 ENCOUNTER — Encounter (INDEPENDENT_AMBULATORY_CARE_PROVIDER_SITE_OTHER): Payer: Self-pay | Admitting: Surgery

## 2011-09-07 ENCOUNTER — Encounter: Payer: Self-pay | Admitting: *Deleted

## 2011-09-07 ENCOUNTER — Ambulatory Visit
Admission: RE | Admit: 2011-09-07 | Discharge: 2011-09-07 | Disposition: A | Discharge status: Home / Self Care | Payer: Managed Care, Other (non HMO) | Source: Ambulatory Visit | Attending: Family Medicine | Admitting: Family Medicine

## 2011-09-07 ENCOUNTER — Ambulatory Visit (INDEPENDENT_AMBULATORY_CARE_PROVIDER_SITE_OTHER): Payer: Managed Care, Other (non HMO) | Admitting: Surgery

## 2011-09-07 VITALS — BP 120/76 | HR 64 | Temp 98.5°F | Resp 14 | Ht 63.0 in | Wt 206.0 lb

## 2011-09-07 VITALS — Ht 63.0 in | Wt 206.4 lb

## 2011-09-07 DIAGNOSIS — Z09 Encounter for follow-up examination after completed treatment for conditions other than malignant neoplasm: Secondary | ICD-10-CM | POA: Insufficient documentation

## 2011-09-07 DIAGNOSIS — E669 Obesity, unspecified: Secondary | ICD-10-CM | POA: Insufficient documentation

## 2011-09-07 DIAGNOSIS — Z1231 Encounter for screening mammogram for malignant neoplasm of breast: Secondary | ICD-10-CM

## 2011-09-07 DIAGNOSIS — Z9884 Bariatric surgery status: Secondary | ICD-10-CM

## 2011-09-07 DIAGNOSIS — Z713 Dietary counseling and surveillance: Secondary | ICD-10-CM | POA: Insufficient documentation

## 2011-09-07 NOTE — Patient Instructions (Addendum)
Goals:  Follow Phase 3B: High Protein + Non-Starchy Vegetables  Add a protein source with all carbs (ex: fruit at snack)  Eat 3-6 small meals/snacks, every 3-5 hrs  Continue goal of lean protein foods to meet 60-80g goal  Increase fluid intake to 64oz +  Avoid drinking 15 minutes before, during and 30 minutes after eating  Aim for >30 min of physical activity daily  KEEP UP THE GREAT JOB!!!

## 2011-09-07 NOTE — Progress Notes (Signed)
Hannah Branch 61 y.o.  Body mass index is 36.49 kg/(m^2).  Patient Active Problem List  Diagnosis  . OSA (obstructive sleep apnea)  . Obesity  . 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 Breast surgery 2000    lumpectomy x2 right  . Abdominal hysterectomy 2002    complete hysterectomy  . 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  9 month followup:  Hannah Branch looks great. She's 9 months out and has lost 121 pounds since her Roux-en-Y gastric bypass. She's not having any symptoms of nausea vomiting heartburn constipation or diarrhea. She has not seen Dr. Sherral Hammers. . I may go ahead and do some postoperative labs on her today and plan to see her back in December which would be her one year postop visit. Matt B. Daphine Deutscher, MD, [facility] [phone] beeper [phone]  09/07/2011 10:42 AM

## 2011-09-07 NOTE — Progress Notes (Signed)
  Follow-up visit:  9 Months Post-Operative Gastric Bypass Surgery  Medical Nutrition Therapy:  Appt start time:  930   End time: 1000.  Primary concerns today:  Post-Operative Bariatric Surgery Nutrition Management.  Surgery date: 12/12/10  Surgery type: Gastric Bypass  Start weight at [facility]: 320 lbs  Weight today: 206.4 lbs  Weight change: 37.8 lbs  Total weight lost: 114.9 lbs (total at [facility])  BMI: 36.6 kg/m^2  Goal weight: 160 lbs % Goal met: 72%  24-hr recall: B (7-8 AM): Unjury Shake w/ 1% milk Snk (AM): 1/2 banana or 1/4 c strawberries or boiled egg    L (PM): Cucumbers and tomato w/ lite dressing and 3 oz roast beef on 1 sl bread Snk (PM):  Fruit or NONE D (PM): 3-4 oz chicken, 1/4 cup rice  Snk (PM): None OR a handful of nuts OR a few pork skins  Fluid intake: Water, protein shake, Crystal Light = 45-50 oz Estimated total protein intake: 70-80g  Medications: Only on Hyzaar for BP Supplementation: MVI, B12, Ca+ Citrate  Using straws: Yes - reports some gas Drinking while eating: No Hair loss: No Carbonated beverages: None reported N/V/D/C: Nausea (~1 time/week with new foods - last time from 2-3 bites mac-n-chs); diarrhea resolved Dumping syndrome: No  Recent physical activity:  Increased ADLs; No structured activity. Tries to walk as much as possible during the day. Now helping care for her 4 grandchildren (58 mos - 44 yrs old).    Progress Towards Goal(s):  In progress.   Nutritional Diagnosis:  [place]-3.3 Overweight/obesity related to past poor dietary habits and physical inactivity as evidenced by patient w/ recent Gastric Bypass surgery following dietary guidelines for continued weight loss.    Intervention:  Nutrition education.  Monitoring/Evaluation:  Dietary intake, exercise, and body weight. Follow up in 3 months for 12 month post-op visit.

## 2011-09-07 NOTE — Patient Instructions (Signed)
You are doing very well. Will check labs and see you again in December for 1 year visit.

## 2011-09-07 NOTE — Addendum Note (Signed)
Addended byLiliana Cline on: 09/07/2011 10:49 AM   Modules accepted: Orders

## 2011-12-28 ENCOUNTER — Ambulatory Visit (INDEPENDENT_AMBULATORY_CARE_PROVIDER_SITE_OTHER): Payer: Managed Care, Other (non HMO) | Admitting: Surgery

## 2011-12-28 ENCOUNTER — Encounter (INDEPENDENT_AMBULATORY_CARE_PROVIDER_SITE_OTHER): Payer: Self-pay | Admitting: Surgery

## 2011-12-28 VITALS — BP 126/84 | HR 68 | Temp 97.8°F | Resp 16 | Ht 63.0 in | Wt 192.8 lb

## 2011-12-28 DIAGNOSIS — Z9884 Bariatric surgery status: Secondary | ICD-10-CM

## 2011-12-28 NOTE — Patient Instructions (Signed)
Thanks for your patience.  If you need further assistance after leaving the office, please call our office and speak with a [facility] nurse.  [phone].  If you want to leave a message for Dr. Tamesha Ellerbrock, please call his office phone at [phone]. 

## 2011-12-28 NOTE — Progress Notes (Signed)
Hannah Branch 61 y.o.  Body mass index is 34.15 kg/(m^2).  Patient Active Problem List  Diagnosis  . OSA (obstructive sleep apnea)  . Obesity  . 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 Breast surgery 2000    lumpectomy x2 right  . Abdominal hysterectomy 2002    complete hysterectomy  . 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  Hannah Branch has done very well after her lap Roux-en-Y gastric bypass. This was done a year ago. Since that time she has lost 134 pounds down to 192 pounds weight. Her obstructive sleep apnea is gone and her blood pressure medications have been cut in half. Overall she looks great and we will get up postop picture to compare with her preop. She had her labs checked by Dr. Sherral Hammers last week and she said everything looked fine. I plan to see her again in one year in routine followup. Hannah B. Daphine Deutscher, MD, [facility] [phone] beeper [phone]  12/28/2011 10:55 AM

## 2012-08-19 ENCOUNTER — Other Ambulatory Visit: Payer: Self-pay

## 2012-08-19 DIAGNOSIS — Z1231 Encounter for screening mammogram for malignant neoplasm of breast: Secondary | ICD-10-CM

## 2012-09-18 ENCOUNTER — Ambulatory Visit
Admission: RE | Admit: 2012-09-18 | Discharge: 2012-09-18 | Disposition: A | Discharge status: Home / Self Care | Payer: BC Managed Care – PPO | Source: Ambulatory Visit

## 2012-09-18 DIAGNOSIS — Z1231 Encounter for screening mammogram for malignant neoplasm of breast: Secondary | ICD-10-CM

## 2013-08-04 ENCOUNTER — Other Ambulatory Visit: Payer: Self-pay

## 2013-08-04 DIAGNOSIS — Z1231 Encounter for screening mammogram for malignant neoplasm of breast: Secondary | ICD-10-CM

## 2013-09-21 ENCOUNTER — Ambulatory Visit
Admission: RE | Admit: 2013-09-21 | Discharge: 2013-09-21 | Disposition: A | Discharge status: Home / Self Care | Payer: BC Managed Care – PPO | Source: Ambulatory Visit

## 2013-09-21 DIAGNOSIS — Z1231 Encounter for screening mammogram for malignant neoplasm of breast: Secondary | ICD-10-CM

## 2014-08-27 ENCOUNTER — Other Ambulatory Visit: Payer: Self-pay

## 2014-08-27 DIAGNOSIS — Z1231 Encounter for screening mammogram for malignant neoplasm of breast: Secondary | ICD-10-CM

## 2014-10-04 ENCOUNTER — Ambulatory Visit
Admission: RE | Admit: 2014-10-04 | Discharge: 2014-10-04 | Disposition: A | Discharge status: Home / Self Care | Payer: Medicare Other | Source: Ambulatory Visit

## 2014-10-04 DIAGNOSIS — Z1231 Encounter for screening mammogram for malignant neoplasm of breast: Secondary | ICD-10-CM

## 2014-10-05 ENCOUNTER — Ambulatory Visit: Payer: Self-pay

## 2015-07-14 ENCOUNTER — Other Ambulatory Visit: Payer: Self-pay | Admitting: Internal Medicine

## 2015-07-14 DIAGNOSIS — Z1231 Encounter for screening mammogram for malignant neoplasm of breast: Secondary | ICD-10-CM

## 2015-09-23 DIAGNOSIS — M79674 Pain in right toe(s): Secondary | ICD-10-CM | POA: Diagnosis not present

## 2015-09-23 DIAGNOSIS — L03031 Cellulitis of right toe: Secondary | ICD-10-CM | POA: Diagnosis not present

## 2015-09-23 DIAGNOSIS — R030 Elevated blood-pressure reading, without diagnosis of hypertension: Secondary | ICD-10-CM | POA: Diagnosis not present

## 2015-09-25 DIAGNOSIS — L03031 Cellulitis of right toe: Secondary | ICD-10-CM | POA: Diagnosis not present

## 2015-10-06 ENCOUNTER — Ambulatory Visit
Admission: RE | Admit: 2015-10-06 | Discharge: 2015-10-06 | Disposition: A | Discharge status: Home / Self Care | Payer: Medicare Other | Source: Ambulatory Visit | Attending: Internal Medicine | Admitting: Internal Medicine

## 2015-10-06 DIAGNOSIS — Z1231 Encounter for screening mammogram for malignant neoplasm of breast: Secondary | ICD-10-CM

## 2015-10-12 ENCOUNTER — Encounter (HOSPITAL_COMMUNITY): Payer: Self-pay

## 2016-07-09 ENCOUNTER — Encounter (HOSPITAL_COMMUNITY): Payer: Self-pay

## 2016-08-27 ENCOUNTER — Other Ambulatory Visit: Payer: Self-pay | Admitting: Internal Medicine

## 2016-08-27 DIAGNOSIS — Z1231 Encounter for screening mammogram for malignant neoplasm of breast: Secondary | ICD-10-CM

## 2016-10-08 ENCOUNTER — Ambulatory Visit
Admission: RE | Admit: 2016-10-08 | Discharge: 2016-10-08 | Disposition: A | Discharge status: Home / Self Care | Payer: PPO | Source: Ambulatory Visit | Attending: Internal Medicine | Admitting: Internal Medicine

## 2016-10-08 DIAGNOSIS — Z1231 Encounter for screening mammogram for malignant neoplasm of breast: Secondary | ICD-10-CM

## 2016-10-08 HISTORY — DX: Malignant neoplasm of unspecified site of unspecified female breast: C50.919

## 2016-10-08 HISTORY — DX: Personal history of irradiation: Z92.3

## 2017-02-20 DIAGNOSIS — J181 Lobar pneumonia, unspecified organism: Secondary | ICD-10-CM | POA: Diagnosis not present

## 2017-02-20 DIAGNOSIS — R05 Cough: Secondary | ICD-10-CM | POA: Diagnosis not present

## 2017-02-20 DIAGNOSIS — Z0000 Encounter for general adult medical examination without abnormal findings: Secondary | ICD-10-CM | POA: Diagnosis not present

## 2017-03-21 DIAGNOSIS — J181 Lobar pneumonia, unspecified organism: Secondary | ICD-10-CM | POA: Diagnosis not present

## 2017-04-12 ENCOUNTER — Emergency Department (HOSPITAL_BASED_OUTPATIENT_CLINIC_OR_DEPARTMENT_OTHER)
Admission: EM | Admit: 2017-04-12 | Discharge: 2017-04-12 | Disposition: A | Discharge status: Home / Self Care | Payer: PPO | Attending: Emergency Medicine | Admitting: Emergency Medicine

## 2017-04-12 ENCOUNTER — Other Ambulatory Visit: Payer: Self-pay

## 2017-04-12 ENCOUNTER — Emergency Department (HOSPITAL_BASED_OUTPATIENT_CLINIC_OR_DEPARTMENT_OTHER): Payer: PPO

## 2017-04-12 ENCOUNTER — Encounter (HOSPITAL_BASED_OUTPATIENT_CLINIC_OR_DEPARTMENT_OTHER): Payer: Self-pay | Admitting: *Deleted

## 2017-04-12 DIAGNOSIS — R1111 Vomiting without nausea: Secondary | ICD-10-CM | POA: Diagnosis not present

## 2017-04-12 DIAGNOSIS — N23 Unspecified renal colic: Secondary | ICD-10-CM

## 2017-04-12 DIAGNOSIS — I10 Essential (primary) hypertension: Secondary | ICD-10-CM | POA: Diagnosis not present

## 2017-04-12 DIAGNOSIS — R3129 Other microscopic hematuria: Secondary | ICD-10-CM | POA: Diagnosis not present

## 2017-04-12 DIAGNOSIS — N201 Calculus of ureter: Secondary | ICD-10-CM | POA: Insufficient documentation

## 2017-04-12 DIAGNOSIS — D649 Anemia, unspecified: Secondary | ICD-10-CM | POA: Insufficient documentation

## 2017-04-12 DIAGNOSIS — R1031 Right lower quadrant pain: Secondary | ICD-10-CM | POA: Diagnosis present

## 2017-04-12 DIAGNOSIS — Z79899 Other long term (current) drug therapy: Secondary | ICD-10-CM | POA: Diagnosis not present

## 2017-04-12 DIAGNOSIS — D509 Iron deficiency anemia, unspecified: Secondary | ICD-10-CM | POA: Diagnosis not present

## 2017-04-12 DIAGNOSIS — R110 Nausea: Secondary | ICD-10-CM | POA: Diagnosis not present

## 2017-04-12 DIAGNOSIS — R300 Dysuria: Secondary | ICD-10-CM | POA: Diagnosis not present

## 2017-04-12 DIAGNOSIS — R109 Unspecified abdominal pain: Secondary | ICD-10-CM | POA: Diagnosis not present

## 2017-04-12 DIAGNOSIS — R1030 Lower abdominal pain, unspecified: Secondary | ICD-10-CM | POA: Diagnosis not present

## 2017-04-12 LAB — URINALYSIS, ROUTINE W REFLEX MICROSCOPIC
Bilirubin Urine: NEGATIVE
GLUCOSE, UA: NEGATIVE mg/dL
KETONES UR: 15 mg/dL — AB
LEUKOCYTES UA: NEGATIVE
Nitrite: NEGATIVE
PROTEIN: 30 mg/dL — AB
Specific Gravity, Urine: >1.03 — ABNORMAL HIGH (ref 1.005–1.030)
pH: 6 (ref 5.0–8.0)

## 2017-04-12 LAB — URINALYSIS, MICROSCOPIC (REFLEX)

## 2017-04-12 LAB — BASIC METABOLIC PANEL
Anion gap: 9 (ref 5–15)
BUN: 23 mg/dL — ABNORMAL HIGH (ref 6–20)
CALCIUM: 8.5 mg/dL — AB (ref 8.9–10.3)
CHLORIDE: 106 mmol/L (ref 101–111)
CO2: 25 mmol/L (ref 22–32)
CREATININE: 0.92 mg/dL (ref 0.44–1.00)
GFR calc non Af Amer: >60 mL/min (ref >60)
Glucose, Bld: 100 mg/dL — ABNORMAL HIGH (ref 65–99)
Potassium: 3.6 mmol/L (ref 3.5–5.1)
SODIUM: 140 mmol/L (ref 135–145)

## 2017-04-12 LAB — CBC
HCT: 30 % — ABNORMAL LOW (ref 36.0–46.0)
HEMOGLOBIN: 8.8 g/dL — AB (ref 12.0–15.0)
MCH: 22 pg — ABNORMAL LOW (ref 26.0–34.0)
MCHC: 29.3 g/dL — AB (ref 30.0–36.0)
MCV: 75 fL — ABNORMAL LOW (ref 78.0–100.0)
Platelets: 210 10*3/uL (ref 150–400)
RBC: 4 MIL/uL (ref 3.87–5.11)
RDW: 18.1 % — AB (ref 11.5–15.5)
WBC: 5.3 10*3/uL (ref 4.0–10.5)

## 2017-04-12 MED ORDER — HYDROCODONE-ACETAMINOPHEN 5-325 MG PO TABS: 1.0000 | ORAL_TABLET | Freq: Four times a day (QID) | ORAL | 0 refills | Status: DC | PRN

## 2017-04-12 MED ORDER — ONDANSETRON HCL 4 MG/2ML IJ SOLN
4.0000 mg | Freq: Once | INTRAMUSCULAR | Status: AC
  Administered 2017-04-12: 4 mg via INTRAVENOUS
  Filled 2017-04-12: qty 2

## 2017-04-12 MED ORDER — TAMSULOSIN HCL 0.4 MG PO CAPS: 0.4000 mg | ORAL_CAPSULE | Freq: Every day | ORAL | 0 refills | Status: DC

## 2017-04-12 MED ORDER — OXYCODONE-ACETAMINOPHEN 5-325 MG PO TABS
1.0000 | ORAL_TABLET | Freq: Once | ORAL | Status: AC
  Administered 2017-04-12: 1 via ORAL
  Filled 2017-04-12: qty 1

## 2017-04-12 MED ORDER — HYDROMORPHONE HCL 1 MG/ML IJ SOLN
0.5000 mg | Freq: Once | INTRAMUSCULAR | Status: AC
  Administered 2017-04-12: 0.5 mg via INTRAVENOUS
  Filled 2017-04-12: qty 1

## 2017-04-12 MED ORDER — KETOROLAC TROMETHAMINE 15 MG/ML IJ SOLN
15.0000 mg | Freq: Once | INTRAMUSCULAR | Status: AC
  Administered 2017-04-12: 15 mg via INTRAVENOUS
  Filled 2017-04-12: qty 1

## 2017-04-12 MED ORDER — ONDANSETRON 4 MG PO TBDP: 4.0000 mg | ORAL_TABLET | Freq: Three times a day (TID) | ORAL | 0 refills | Status: DC | PRN

## 2017-04-12 NOTE — ED Triage Notes (Signed)
Right flank pain since yesterday. She was seen at [facility] today and told she may have a kidney stone.

## 2017-04-12 NOTE — ED Provider Notes (Signed)
[facility]ER[facility]ENC[facility] DEPARTMENT Provider Note   CSN: [id number] Arrival date & time: 04/12/17  1729     History   Chief Complaint Chief Complaint  Patient presents with  . Flank Pain    HPI Hannah Branch is a 67 y.o. female.  Patient with history of Roux-en-Y gastric bypass surgery, hysterectomy presents with acute onset of right flank pain with radiation to the right groin starting yesterday.  Patient has also had some dysuria and dark urine.  She went to an urgent care today and had a urine dipstick which demonstrated blood in the urine and she was sent here for concern of kidney stone.  Patient has not had a kidney stone in the past.  Pain has been severe at times and associated with vomiting.  No fevers, chest pain, shortness of breath.  No other urine changes or bowel/stool changes. The course is constant. Aggravating factors: none. Alleviating factors: none.       Past Medical History:  Diagnosis Date  . Breast cancer ([facility])   . Cancer ([facility]) 2000   breast- right, uterine, radiation tx done  . Hypertension   . OSA (obstructive sleep apnea) 10/10/10   setting 10  . Personal history of radiation therapy     Patient Active Problem List   Diagnosis Date Noted  . 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 Obesity 02/22/2011    Past Surgical History:  Procedure Laterality Date  . ABDOMINAL HYSTERECTOMY  2002   complete hysterectomy  . BREAST EXCISIONAL BIOPSY    . BREAST LUMPECTOMY Left    2000  . BREAST SURGERY  2000   lumpectomy x2 right  . GASTRIC ROUX-EN-Y  12/12/2010   Procedure: LAPAROSCOPIC ROUX-EN-Y GASTRIC;  Surgeon: Pedro Earls, MD;  Location: [facility];  Service: General;  Laterality: N/A;  upper endoscopy     OB History   None      Home Medications    Prior to Admission medications   Medication Sig Start Date End Date Taking? Authorizing Provider  losartan-hydrochlorothiazide (HYZAAR) 100-25 MG per tablet Take 0.5 tablets by  mouth every evening.     [provider]    Family History Family History  Problem Relation Age of Onset  . Cancer Maternal Aunt        breast  . Breast cancer Maternal Aunt   . Cancer Maternal Grandmother        pancreatic  . Emphysema Father   . Heart disease Father   . Breast cancer Maternal Aunt     Social History Social History   Tobacco Use  . Smoking status: Never Smoker  . Smokeless tobacco: Never Used  Substance Use Topics  . Alcohol use: No  . Drug use: No     Allergies   Patient has no known allergies.   Review of Systems Review of Systems  Constitutional: Negative for fever.  HENT: Negative for rhinorrhea and sore throat.   Eyes: Negative for redness.  Respiratory: Negative for cough.   Cardiovascular: Negative for chest pain.  Gastrointestinal: Positive for abdominal pain. Negative for diarrhea, nausea and vomiting.  Genitourinary: Positive for dysuria and flank pain. Negative for hematuria.  Musculoskeletal: Negative for myalgias.  Skin: Negative for rash.  Neurological: Negative for headaches.     Physical Exam Updated Vital Signs BP (!) 164/87 (BP Location: Left Arm)   Pulse 72   Temp 98.3 F (36.8 C) (Oral)   Resp 18  Ht 5\' 2"  (1.575 m)   Wt 81.6 kg (180 lb)   SpO2 100%   BMI 32.92 kg/m   Physical Exam  Constitutional: She appears well-developed and well-nourished.  HENT:  Head: Normocephalic and atraumatic.  Eyes: Conjunctivae are normal. Right eye exhibits no discharge. Left eye exhibits no discharge.  Neck: Normal range of motion. Neck supple.  Cardiovascular: Normal rate, regular rhythm and normal heart sounds.  Pulmonary/Chest: Effort normal and breath sounds normal.  Abdominal: Soft. There is no tenderness. There is CVA tenderness (Right-sided). There is no rebound and no guarding.  Neurological: She is alert.  Skin: Skin is warm and dry.  Psychiatric: She has a normal mood and affect.  Nursing note and vitals  reviewed.    ED Treatments / Results  Labs (all labs ordered are listed, but only abnormal results are displayed) Labs Reviewed  URINALYSIS, ROUTINE W REFLEX MICROSCOPIC - Abnormal; Notable for the following components:      Result Value   APPearance CLOUDY (*)    Specific Gravity, Urine >1.030 (*)    Hgb urine dipstick LARGE (*)    Ketones, ur 15 (*)    Protein, ur 30 (*)    All other components within normal limits  CBC - Abnormal; Notable for the following components:   Hemoglobin 8.8 (*)    HCT 30.0 (*)    MCV 75.0 (*)    MCH 22.0 (*)    MCHC 29.3 (*)    RDW 18.1 (*)    All other components within normal limits  BASIC METABOLIC PANEL - Abnormal; Notable for the following components:   Glucose, Bld 100 (*)    BUN 23 (*)    Calcium 8.5 (*)    All other components within normal limits  URINALYSIS, MICROSCOPIC (REFLEX) - Abnormal; Notable for the following components:   Bacteria, UA RARE (*)    Squamous Epithelial / LPF 0-5 (*)    All other components within normal limits    EKG None  Radiology Ct Renal Stone Study  Result Date: 04/12/2017 CLINICAL DATA:  Acute onset right flank and right lower quadrant pain with nausea and vomiting. Hematuria. EXAM: CT ABDOMEN AND PELVIS WITHOUT CONTRAST TECHNIQUE: Multidetector CT imaging of the abdomen and pelvis was performed following the standard protocol without IV contrast. COMPARISON:  None. FINDINGS: Lower chest: Unremarkable Hepatobiliary: The liver shows diffusely decreased attenuation suggesting steatosis. There is no evidence for gallstones, gallbladder wall thickening, or pericholecystic fluid. No intrahepatic or extrahepatic biliary dilation. Pancreas: No focal mass lesion. No dilatation of the main duct. No intraparenchymal cyst. No peripancreatic edema. Spleen: No splenomegaly. No focal mass lesion. Adrenals/Urinary Tract: No adrenal nodule or mass. Left kidney and ureter unremarkable. 4 x 4 x 6 mm stone in the distal right  ureter/UVJ causes mild to moderate right hydroureteronephrosis. No other right urinary stones evident. Bladder is decompressed without evidence for bladder stones. Stomach/Bowel: Status post gastric bypass. Duodenum is normally positioned as is the ligament of Treitz. No small bowel wall thickening. No small bowel dilatation. The terminal ileum is normal. The appendix is normal. No gross colonic mass. No colonic wall thickening. No substantial diverticular change. Vascular/Lymphatic: There is abdominal aortic atherosclerosis without aneurysm. There is no gastrohepatic or hepatoduodenal ligament lymphadenopathy. No intraperitoneal or retroperitoneal lymphadenopathy. No pelvic sidewall lymphadenopathy. Reproductive: Uterus surgically absent.  There is no adnexal mass. Other: No intraperitoneal free fluid. Musculoskeletal: Bone windows reveal no worrisome lytic or sclerotic osseous lesions. Small umbilical hernia contains only  fat. IMPRESSION: 1. 4 x 4 x 6 mm distal right ureteral stone, at the UVJ causes mild to moderate right hydroureteronephrosis. No other urinary stone disease evident. 2. Small umbilical hernia contains only fat. 3.  Aortic Atherosclerois (ICD10-170.0) Electronically Signed   By: Misty Stanley M.D.   On: 04/12/2017 18:58    Procedures Procedures (including critical care time)  Medications Ordered in ED Medications  ketorolac (TORADOL) 15 MG/ML injection 15 mg (has no administration in time range)  HYDROmorphone (DILAUDID) injection 0.5 mg (0.5 mg Intravenous Given 04/12/17 1817)  ondansetron (ZOFRAN) injection 4 mg (4 mg Intravenous Given 04/12/17 1818)     Initial Impression / Assessment and Plan / ED Course  I have reviewed the triage vital signs and the nursing notes.  Pertinent labs & imaging results that were available during my care of the patient were reviewed by me and considered in my medical decision making (see chart for details).  Clinical Course as of Apr 13 1935  Fri  Apr 13, 5546  5233 67 year old female with right flank into right lower quadrant abdominal pain.  By urinalysis she had gross hematuria and by CT has a UVJ stone.  She still in a good amount of pain and were trying to get pain control on her.  Her renal function is good.  If we can get her pain under control she would be appropriate for outpatient follow-up with urology.   [MB]    Clinical Course User Index [MB] Hayden Rasmussen, MD    Patient seen and examined. Work-up initiated. Medications ordered. Patient did drive but states daughter can come pick her up.   Vital signs reviewed and are as follows: BP (!) 164/87 (BP Location: Left Arm)   Pulse 72   Temp 98.3 F (36.8 C) (Oral)   Resp 18   Ht 5\' 2"  (1.575 m)   Wt 81.6 kg (180 lb)   SpO2 100%   BMI 32.92 kg/m   7:17 PM patient with improvement in pain but continued significant pain after Dilaudid.  Toradol ordered.  CT reviewed by myself.   Discussed low hemoglobin with patient.  She does not have a history of anemia.  Last hemoglobin in our system was 6-1/2 years ago.  She does not follow closely with a doctor regarding previous Roux-en-Y and possible dietary deficiencies.  She denies any blood in the stool or melena.  She denies vaginal bleeding or easy bruising.  She has has not been short of breath or fatigued recently.  Pt discussed with and seen by Dr. Melina Copa.   7:58 PM patient feels better after Toradol.  Plan for d/c in 30 minutes if pain remains controlled.   Patient counseled on kidney stone treatment. Urged patient to strain urine and save any stones. Urged urology follow-up and return to [facility] with any complications. Counseled patient to maintain good fluid intake.   Counseled patient on use of Flomax.   Patient counseled on use of narcotic pain medications. Counseled not to combine these medications with others containing tylenol. Urged not to drink alcohol, drive, or perform any other activities that requires focus  while taking these medications. The patient verbalizes understanding and agrees with the plan.  Handoff to Acequia PA-C at shift change.    Final Clinical Impressions(s) / ED Diagnoses   Final diagnoses:  Ureteral colic  Microcytic anemia   Patient with ureteral colic from a 6 mm UVJ stone.  Pain is currently controlled.  Normal kidney function.  No urinary tract infection.  Urology follow-up and symptom control indicated at this time with return if worsening.  Microcytic anemia: Suspect given current exam and symptoms that this is chronic.  Patient will have this closely followed as an outpatient.  She does not have any active bleeding.  She denies melena or hematochezia.  No easy bruising or bleeding.  Normal pulse without shortness of breath.  No lightheadedness or dizziness.  No indications for admission at this time regarding this.   ED Discharge Orders        Ordered    HYDROcodone-acetaminophen (NORCO/VICODIN) 5-325 MG tablet  Every 6 hours PRN     04/12/17 1951    tamsulosin (FLOMAX) 0.4 MG CAPS capsule  Daily     04/12/17 1951    ondansetron (ZOFRAN ODT) 4 MG disintegrating tablet  Every 8 hours PRN     04/12/17 1951       Carlisle Cater, PA-C 04/12/17 2000    Hayden Rasmussen, MD 04/13/17 [phone]

## 2017-04-12 NOTE — Discharge Instructions (Signed)
Please read and follow all provided instructions.  Your diagnoses today include:  1. Ureteral colic   2. Microcytic anemia     Tests performed today include:  Urine test that showed blood in your urine and no infection, also shows calcium oxalate crystal  CT scan which showed a 6 millimeter kidney on the right side  Blood test that showed normal kidney function  Blood counts and electrolytes -you have a low red blood cell counts, your hemoglobin is 8.8 and needs to be rechecked by your doctor in 1 week  Vital signs. See below for your results today.   Medications prescribed:   Vicodin (hydrocodone/acetaminophen) - narcotic pain medication  DO NOT drive or perform any activities that require you to be awake and alert because this medicine can make you drowsy. BE VERY CAREFUL not to take multiple medicines containing Tylenol (also called acetaminophen). Doing so can lead to an overdose which can damage your liver and cause liver failure and possibly death.   Flomax (tamsulosin) - relaxes smooth muscle to help kidney stones pass   Zofran (ondansetron) - for nausea and vomiting  Take any prescribed medications only as directed.  Home care instructions:  Follow any educational materials contained in this packet.  Please double your fluid intake for the next several days. Strain your urine and save any stones that may pass.   Follow-up instructions: Please follow-up with your urologist or the urologist referral (provided on front page) in the next 1 week for further evaluation of your symptoms.  If you need to return to the Emergency Department, go to [facility] and not [facility]. The urologists are located at [facility] and can better care for you at this location.  Return instructions:  If you need to return to the Emergency Department, go to [facility] and not [facility]. The urologists are located at [facility] and can better care for  you at this location.   Please return to the Emergency Department if you experience worsening symptoms.  Please return if you develop fever or uncontrolled pain or vomiting.  Please return if you have any other emergent concerns.  Additional Information:  Your vital signs today were: BP (!) 164/87 (BP Location: Left Arm)    Pulse 72    Temp 98.3 F (36.8 C) (Oral)    Resp 18    Ht 5\' 2"  (1.575 m)    Wt 81.6 kg (180 lb)    SpO2 100%    BMI 32.92 kg/m  If your blood pressure (BP) was elevated above 135/85 this visit, please have this repeated by your doctor within one month. --------------

## 2017-04-13 NOTE — ED Provider Notes (Signed)
Patient signed out to me at shift change by Dr. Rondel Oh, PA-C.  Patient just received Toradol and Would like you to follow-up to recheck to see if pain controlled with pain medication.  Plan per [place], [place], is to discharge patient with outpatient follow-up as she was diagnosed with ureteral colic today if her pain is well controlled with recent administration of medications.  She was given pain medication, nausea medication, Flomax, and urology follow-up at discharge.    Rechecked patient and states that her pain is resolved after administration of pain medications.  She is tolerating p.o. in the ED in no acute distress.    Patient appears to be stable for discharge with outpatient urology follow-up.  Discussed strict return precautions for any new or worsening symptoms, fevers, vomiting, worsening pain.  Patient and her family understand the plan and reasons to return.  All questions answered.   Rodney Booze, PA-C 04/13/17 0103    Fredia Sorrow, MD 04/20/17 [phone]

## 2017-06-25 DIAGNOSIS — H2513 Age-related nuclear cataract, bilateral: Secondary | ICD-10-CM | POA: Diagnosis not present

## 2017-06-25 DIAGNOSIS — H524 Presbyopia: Secondary | ICD-10-CM | POA: Diagnosis not present

## 2017-08-22 DIAGNOSIS — H18413 Arcus senilis, bilateral: Secondary | ICD-10-CM | POA: Diagnosis not present

## 2017-08-22 DIAGNOSIS — H2513 Age-related nuclear cataract, bilateral: Secondary | ICD-10-CM | POA: Diagnosis not present

## 2017-08-22 DIAGNOSIS — H2512 Age-related nuclear cataract, left eye: Secondary | ICD-10-CM | POA: Diagnosis not present

## 2017-08-22 DIAGNOSIS — H25043 Posterior subcapsular polar age-related cataract, bilateral: Secondary | ICD-10-CM | POA: Diagnosis not present

## 2017-08-22 DIAGNOSIS — H02831 Dermatochalasis of right upper eyelid: Secondary | ICD-10-CM | POA: Diagnosis not present

## 2017-08-22 DIAGNOSIS — H25013 Cortical age-related cataract, bilateral: Secondary | ICD-10-CM | POA: Diagnosis not present

## 2017-08-28 DIAGNOSIS — Z8542 Personal history of malignant neoplasm of other parts of uterus: Secondary | ICD-10-CM | POA: Diagnosis not present

## 2017-08-28 DIAGNOSIS — Z853 Personal history of malignant neoplasm of breast: Secondary | ICD-10-CM | POA: Diagnosis not present

## 2017-08-28 DIAGNOSIS — Z6841 Body Mass Index (BMI) 40.0 and over, adult: Secondary | ICD-10-CM | POA: Diagnosis not present

## 2017-08-28 DIAGNOSIS — G4733 Obstructive sleep apnea (adult) (pediatric): Secondary | ICD-10-CM | POA: Diagnosis not present

## 2017-08-28 DIAGNOSIS — H2512 Age-related nuclear cataract, left eye: Secondary | ICD-10-CM | POA: Diagnosis not present

## 2017-08-28 DIAGNOSIS — Z9884 Bariatric surgery status: Secondary | ICD-10-CM | POA: Diagnosis not present

## 2017-08-29 DIAGNOSIS — H25041 Posterior subcapsular polar age-related cataract, right eye: Secondary | ICD-10-CM | POA: Diagnosis not present

## 2017-09-02 ENCOUNTER — Other Ambulatory Visit: Payer: Self-pay | Admitting: Internal Medicine

## 2017-09-02 DIAGNOSIS — Z1231 Encounter for screening mammogram for malignant neoplasm of breast: Secondary | ICD-10-CM

## 2017-09-11 DIAGNOSIS — H18413 Arcus senilis, bilateral: Secondary | ICD-10-CM | POA: Diagnosis not present

## 2017-09-11 DIAGNOSIS — H02834 Dermatochalasis of left upper eyelid: Secondary | ICD-10-CM | POA: Diagnosis not present

## 2017-09-11 DIAGNOSIS — H25813 Combined forms of age-related cataract, bilateral: Secondary | ICD-10-CM | POA: Diagnosis not present

## 2017-09-11 DIAGNOSIS — Z853 Personal history of malignant neoplasm of breast: Secondary | ICD-10-CM | POA: Diagnosis not present

## 2017-09-11 DIAGNOSIS — Z87891 Personal history of nicotine dependence: Secondary | ICD-10-CM | POA: Diagnosis not present

## 2017-09-11 DIAGNOSIS — H2511 Age-related nuclear cataract, right eye: Secondary | ICD-10-CM | POA: Diagnosis not present

## 2017-09-11 DIAGNOSIS — H02831 Dermatochalasis of right upper eyelid: Secondary | ICD-10-CM | POA: Diagnosis not present

## 2017-09-11 DIAGNOSIS — G4733 Obstructive sleep apnea (adult) (pediatric): Secondary | ICD-10-CM | POA: Diagnosis not present

## 2017-09-11 DIAGNOSIS — Z6841 Body Mass Index (BMI) 40.0 and over, adult: Secondary | ICD-10-CM | POA: Diagnosis not present

## 2017-09-11 DIAGNOSIS — Z8542 Personal history of malignant neoplasm of other parts of uterus: Secondary | ICD-10-CM | POA: Diagnosis not present

## 2017-10-09 ENCOUNTER — Ambulatory Visit
Admission: RE | Admit: 2017-10-09 | Discharge: 2017-10-09 | Disposition: A | Discharge status: Home / Self Care | Payer: PPO | Source: Ambulatory Visit | Attending: Internal Medicine | Admitting: Internal Medicine

## 2017-10-09 DIAGNOSIS — Z1231 Encounter for screening mammogram for malignant neoplasm of breast: Secondary | ICD-10-CM | POA: Diagnosis not present

## 2017-12-06 DIAGNOSIS — J40 Bronchitis, not specified as acute or chronic: Secondary | ICD-10-CM | POA: Diagnosis not present

## 2017-12-16 DIAGNOSIS — M542 Cervicalgia: Secondary | ICD-10-CM | POA: Diagnosis not present

## 2017-12-16 DIAGNOSIS — R202 Paresthesia of skin: Secondary | ICD-10-CM | POA: Diagnosis not present

## 2018-02-10 DIAGNOSIS — R200 Anesthesia of skin: Secondary | ICD-10-CM | POA: Diagnosis not present

## 2018-02-10 DIAGNOSIS — R202 Paresthesia of skin: Secondary | ICD-10-CM | POA: Diagnosis not present

## 2018-02-15 DIAGNOSIS — R202 Paresthesia of skin: Secondary | ICD-10-CM | POA: Diagnosis not present

## 2018-02-15 DIAGNOSIS — R200 Anesthesia of skin: Secondary | ICD-10-CM | POA: Diagnosis not present

## 2018-03-18 DIAGNOSIS — R200 Anesthesia of skin: Secondary | ICD-10-CM | POA: Diagnosis not present

## 2018-03-18 DIAGNOSIS — R202 Paresthesia of skin: Secondary | ICD-10-CM | POA: Diagnosis not present

## 2018-03-18 DIAGNOSIS — I6381 Other cerebral infarction due to occlusion or stenosis of small artery: Secondary | ICD-10-CM | POA: Diagnosis not present

## 2018-09-03 ENCOUNTER — Other Ambulatory Visit: Payer: Self-pay | Admitting: Internal Medicine

## 2018-09-03 DIAGNOSIS — Z1231 Encounter for screening mammogram for malignant neoplasm of breast: Secondary | ICD-10-CM

## 2018-10-16 ENCOUNTER — Ambulatory Visit
Admission: RE | Admit: 2018-10-16 | Discharge: 2018-10-16 | Disposition: A | Discharge status: Home / Self Care | Payer: PPO | Source: Ambulatory Visit | Attending: Internal Medicine | Admitting: Internal Medicine

## 2018-10-16 ENCOUNTER — Other Ambulatory Visit: Payer: Self-pay

## 2018-10-16 DIAGNOSIS — Z1231 Encounter for screening mammogram for malignant neoplasm of breast: Secondary | ICD-10-CM

## 2018-10-20 DIAGNOSIS — K029 Dental caries, unspecified: Secondary | ICD-10-CM | POA: Diagnosis not present

## 2018-10-20 DIAGNOSIS — L03211 Cellulitis of face: Secondary | ICD-10-CM | POA: Diagnosis not present

## 2018-10-20 DIAGNOSIS — K047 Periapical abscess without sinus: Secondary | ICD-10-CM | POA: Diagnosis not present

## 2018-10-20 DIAGNOSIS — K032 Erosion of teeth: Secondary | ICD-10-CM | POA: Diagnosis not present

## 2018-10-20 DIAGNOSIS — R030 Elevated blood-pressure reading, without diagnosis of hypertension: Secondary | ICD-10-CM | POA: Diagnosis not present

## 2019-01-24 DIAGNOSIS — Z20822 Contact with and (suspected) exposure to covid-19: Secondary | ICD-10-CM | POA: Diagnosis not present

## 2019-01-24 DIAGNOSIS — R5381 Other malaise: Secondary | ICD-10-CM | POA: Diagnosis not present

## 2019-01-24 DIAGNOSIS — J029 Acute pharyngitis, unspecified: Secondary | ICD-10-CM | POA: Diagnosis not present

## 2019-01-24 DIAGNOSIS — I159 Secondary hypertension, unspecified: Secondary | ICD-10-CM | POA: Diagnosis not present

## 2019-01-24 DIAGNOSIS — M791 Myalgia, unspecified site: Secondary | ICD-10-CM | POA: Diagnosis not present

## 2019-01-24 DIAGNOSIS — R519 Headache, unspecified: Secondary | ICD-10-CM | POA: Diagnosis not present

## 2019-01-24 DIAGNOSIS — R5383 Other fatigue: Secondary | ICD-10-CM | POA: Diagnosis not present

## 2019-02-09 DIAGNOSIS — H353110 Nonexudative age-related macular degeneration, right eye, stage unspecified: Secondary | ICD-10-CM | POA: Diagnosis not present

## 2019-02-09 DIAGNOSIS — H43311 Vitreous membranes and strands, right eye: Secondary | ICD-10-CM | POA: Diagnosis not present

## 2019-03-09 DIAGNOSIS — H353110 Nonexudative age-related macular degeneration, right eye, stage unspecified: Secondary | ICD-10-CM | POA: Diagnosis not present

## 2019-03-09 DIAGNOSIS — H43811 Vitreous degeneration, right eye: Secondary | ICD-10-CM | POA: Diagnosis not present

## 2019-03-09 DIAGNOSIS — H43311 Vitreous membranes and strands, right eye: Secondary | ICD-10-CM | POA: Diagnosis not present

## 2019-09-07 ENCOUNTER — Other Ambulatory Visit: Payer: Self-pay | Admitting: Internal Medicine

## 2019-09-07 DIAGNOSIS — Z1231 Encounter for screening mammogram for malignant neoplasm of breast: Secondary | ICD-10-CM

## 2019-10-19 ENCOUNTER — Ambulatory Visit
Admission: RE | Admit: 2019-10-19 | Discharge: 2019-10-19 | Disposition: A | Discharge status: Home / Self Care | Payer: PPO | Source: Ambulatory Visit | Attending: Internal Medicine | Admitting: Internal Medicine

## 2019-10-19 ENCOUNTER — Other Ambulatory Visit: Payer: Self-pay

## 2019-10-19 DIAGNOSIS — Z1231 Encounter for screening mammogram for malignant neoplasm of breast: Secondary | ICD-10-CM | POA: Diagnosis not present

## 2019-10-23 ENCOUNTER — Other Ambulatory Visit: Payer: Self-pay | Admitting: Internal Medicine

## 2019-10-23 DIAGNOSIS — R928 Other abnormal and inconclusive findings on diagnostic imaging of breast: Secondary | ICD-10-CM

## 2019-10-30 ENCOUNTER — Other Ambulatory Visit: Payer: Self-pay | Admitting: Family Medicine

## 2019-11-02 ENCOUNTER — Other Ambulatory Visit: Payer: Self-pay

## 2019-11-02 ENCOUNTER — Ambulatory Visit
Admission: RE | Admit: 2019-11-02 | Discharge: 2019-11-02 | Disposition: A | Discharge status: Home / Self Care | Payer: PPO | Source: Ambulatory Visit | Attending: Internal Medicine | Admitting: Internal Medicine

## 2019-11-02 DIAGNOSIS — R928 Other abnormal and inconclusive findings on diagnostic imaging of breast: Secondary | ICD-10-CM

## 2019-11-02 DIAGNOSIS — N6489 Other specified disorders of breast: Secondary | ICD-10-CM | POA: Diagnosis not present

## 2019-11-19 ENCOUNTER — Other Ambulatory Visit: Payer: Self-pay | Admitting: Family Medicine

## 2019-11-27 ENCOUNTER — Other Ambulatory Visit: Payer: Self-pay | Admitting: Family Medicine

## 2020-01-13 DIAGNOSIS — M25531 Pain in right wrist: Secondary | ICD-10-CM | POA: Diagnosis not present

## 2020-01-13 DIAGNOSIS — Z0000 Encounter for general adult medical examination without abnormal findings: Secondary | ICD-10-CM | POA: Diagnosis not present

## 2020-01-13 DIAGNOSIS — M25532 Pain in left wrist: Secondary | ICD-10-CM | POA: Diagnosis not present

## 2020-06-02 DIAGNOSIS — L309 Dermatitis, unspecified: Secondary | ICD-10-CM | POA: Diagnosis not present

## 2020-07-20 DIAGNOSIS — L309 Dermatitis, unspecified: Secondary | ICD-10-CM | POA: Diagnosis not present

## 2020-07-27 DIAGNOSIS — B86 Scabies: Secondary | ICD-10-CM | POA: Diagnosis not present

## 2020-07-27 DIAGNOSIS — L299 Pruritus, unspecified: Secondary | ICD-10-CM | POA: Diagnosis not present

## 2020-07-31 DIAGNOSIS — W1839XA Other fall on same level, initial encounter: Secondary | ICD-10-CM | POA: Diagnosis not present

## 2020-07-31 DIAGNOSIS — I10 Essential (primary) hypertension: Secondary | ICD-10-CM | POA: Diagnosis not present

## 2020-07-31 DIAGNOSIS — R531 Weakness: Secondary | ICD-10-CM | POA: Diagnosis not present

## 2020-07-31 DIAGNOSIS — R0902 Hypoxemia: Secondary | ICD-10-CM | POA: Diagnosis not present

## 2020-07-31 DIAGNOSIS — R059 Cough, unspecified: Secondary | ICD-10-CM | POA: Diagnosis not present

## 2020-07-31 DIAGNOSIS — D509 Iron deficiency anemia, unspecified: Secondary | ICD-10-CM | POA: Diagnosis not present

## 2020-07-31 DIAGNOSIS — U071 COVID-19: Secondary | ICD-10-CM | POA: Diagnosis not present

## 2020-07-31 DIAGNOSIS — W19XXXA Unspecified fall, initial encounter: Secondary | ICD-10-CM | POA: Diagnosis not present

## 2020-07-31 DIAGNOSIS — R509 Fever, unspecified: Secondary | ICD-10-CM | POA: Diagnosis not present

## 2020-07-31 DIAGNOSIS — Y998 Other external cause status: Secondary | ICD-10-CM | POA: Diagnosis not present

## 2020-09-13 ENCOUNTER — Other Ambulatory Visit: Payer: Self-pay | Admitting: Family Medicine

## 2020-09-13 DIAGNOSIS — Z1231 Encounter for screening mammogram for malignant neoplasm of breast: Secondary | ICD-10-CM

## 2020-10-20 ENCOUNTER — Other Ambulatory Visit: Payer: Self-pay

## 2020-10-20 ENCOUNTER — Ambulatory Visit
Admission: RE | Admit: 2020-10-20 | Discharge: 2020-10-20 | Disposition: A | Discharge status: Home / Self Care | Payer: PPO | Source: Ambulatory Visit | Attending: Family Medicine | Admitting: Family Medicine

## 2020-10-20 DIAGNOSIS — Z1231 Encounter for screening mammogram for malignant neoplasm of breast: Secondary | ICD-10-CM | POA: Diagnosis not present

## 2021-08-31 ENCOUNTER — Other Ambulatory Visit: Payer: Self-pay | Admitting: Family Medicine

## 2021-08-31 DIAGNOSIS — Z1231 Encounter for screening mammogram for malignant neoplasm of breast: Secondary | ICD-10-CM

## 2021-10-23 ENCOUNTER — Ambulatory Visit: Payer: PPO

## 2021-11-21 ENCOUNTER — Ambulatory Visit
Admission: RE | Admit: 2021-11-21 | Discharge: 2021-11-21 | Disposition: A | Discharge status: Home / Self Care | Payer: Medicare PPO | Source: Ambulatory Visit | Attending: Family Medicine | Admitting: Family Medicine

## 2021-11-21 DIAGNOSIS — Z1231 Encounter for screening mammogram for malignant neoplasm of breast: Secondary | ICD-10-CM

## 2021-11-23 ENCOUNTER — Ambulatory Visit: Payer: PPO

## 2022-01-04 DIAGNOSIS — I517 Cardiomegaly: Secondary | ICD-10-CM | POA: Diagnosis not present

## 2022-05-29 IMAGING — MG MM DIGITAL SCREENING BILAT W/ TOMO AND CAD
6 of 10 series · 6 of 30 positions shown · non-contrast
Comparison: Previous exam(s).

CLINICAL DATA: Screening.

EXAM:
DIGITAL SCREENING BILATERAL MAMMOGRAM WITH TOMOSYNTHESIS AND CAD
TECHNIQUE: Bilateral screening digital craniocaudal and mediolateral oblique
mammograms were obtained. Bilateral screening digital breast
tomosynthesis was performed. The images were evaluated with
computer-aided detection.

[L MLO synth-2D (1 of 2)]
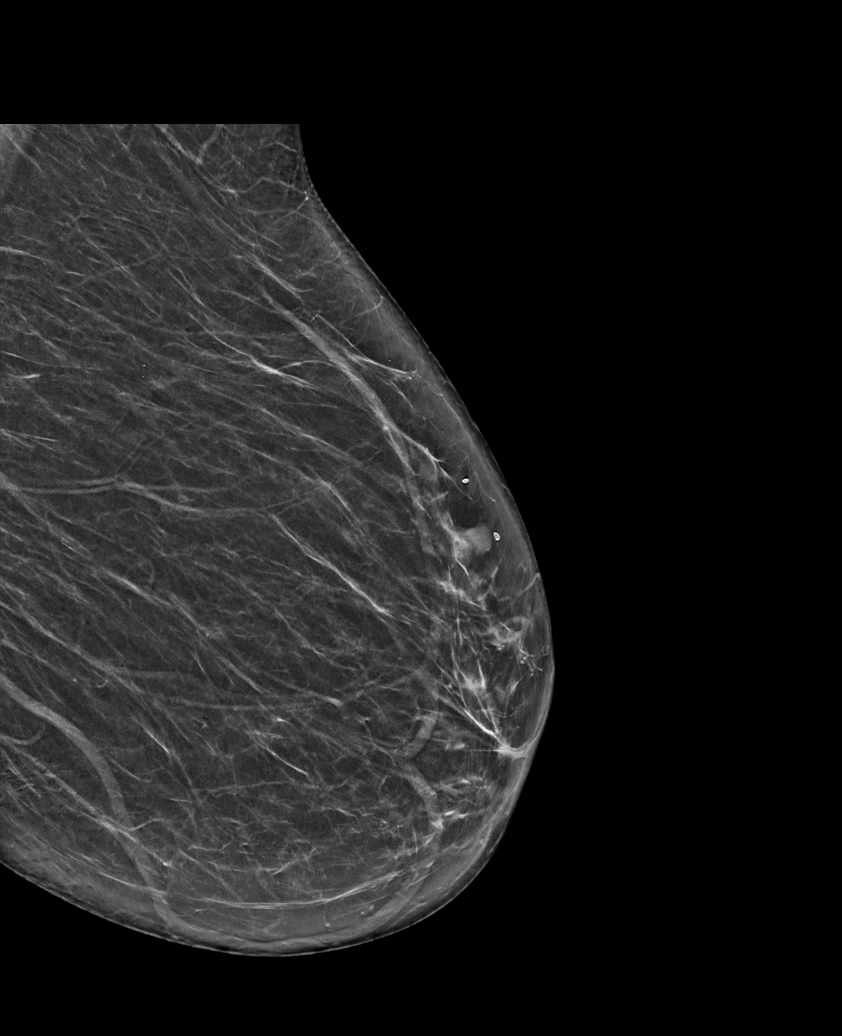

[L MLO synth-2D (2 of 2)]
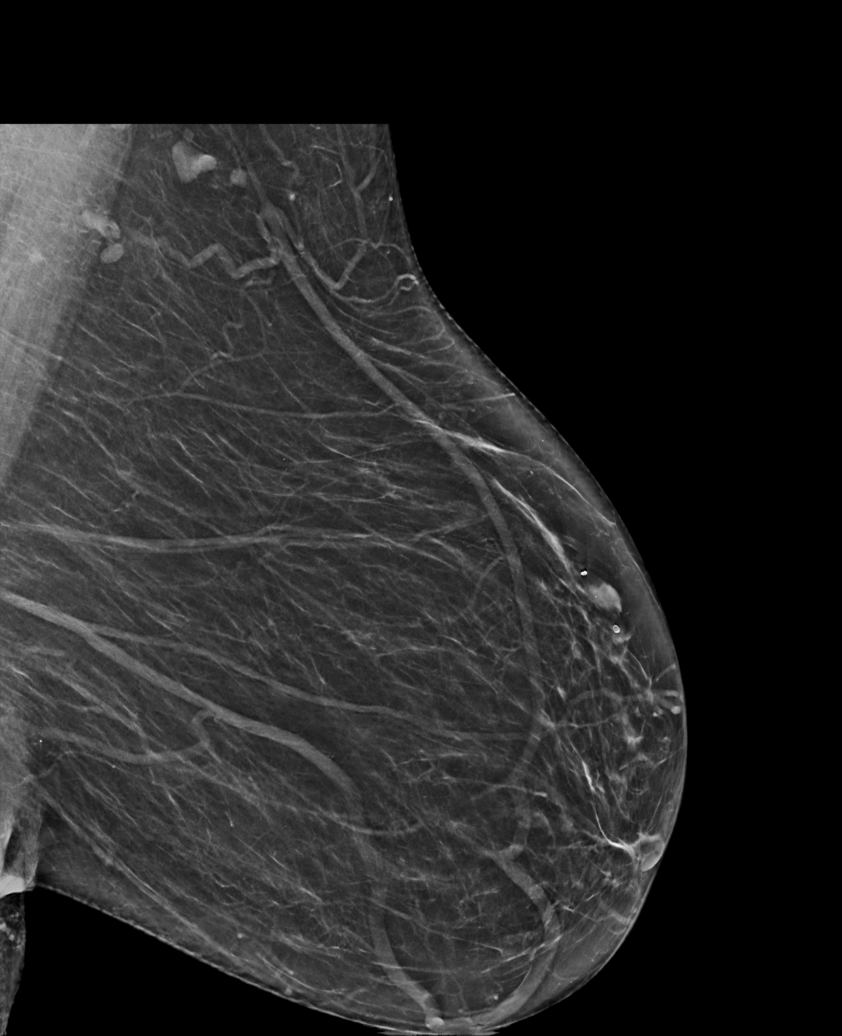

[R MLO synth-2D]
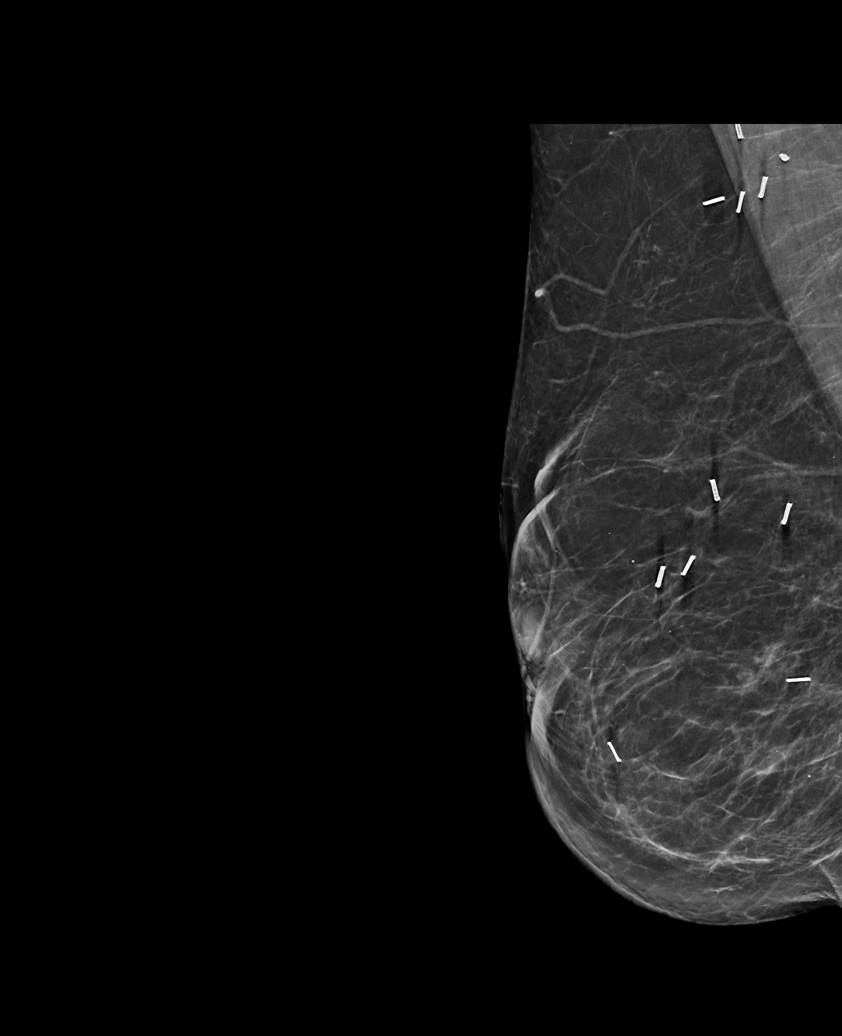

[R CC synth-2D]
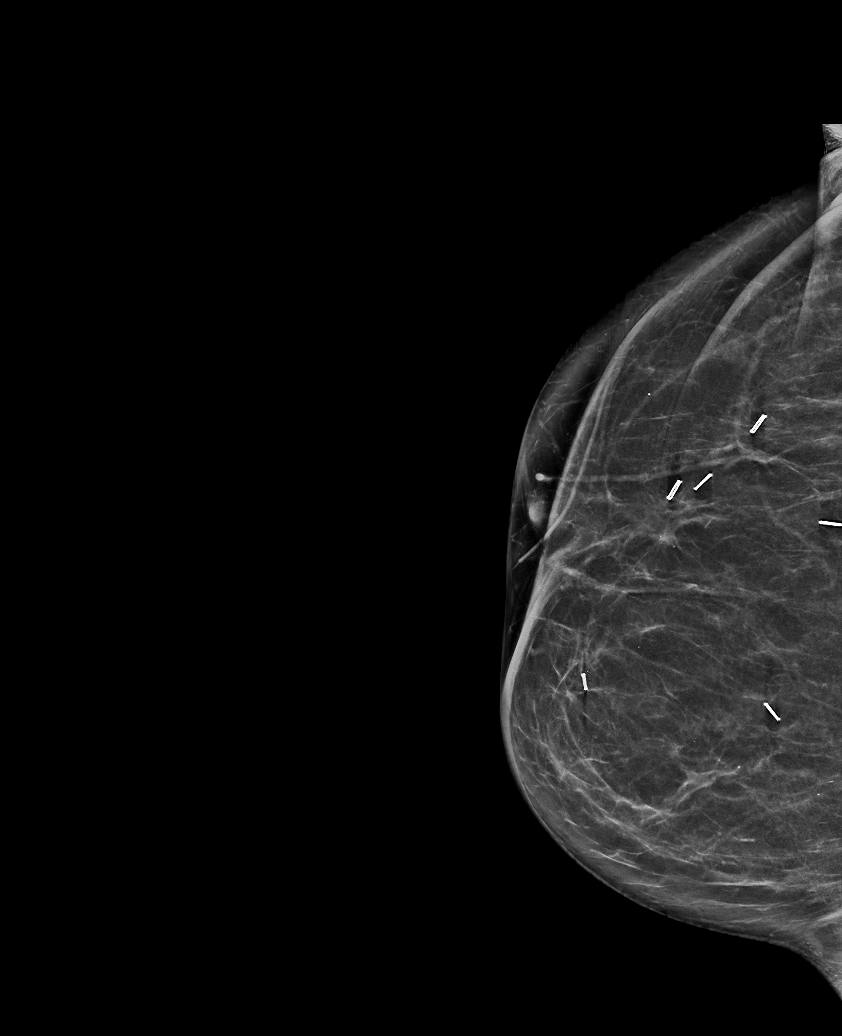

[L CC synth-2D]
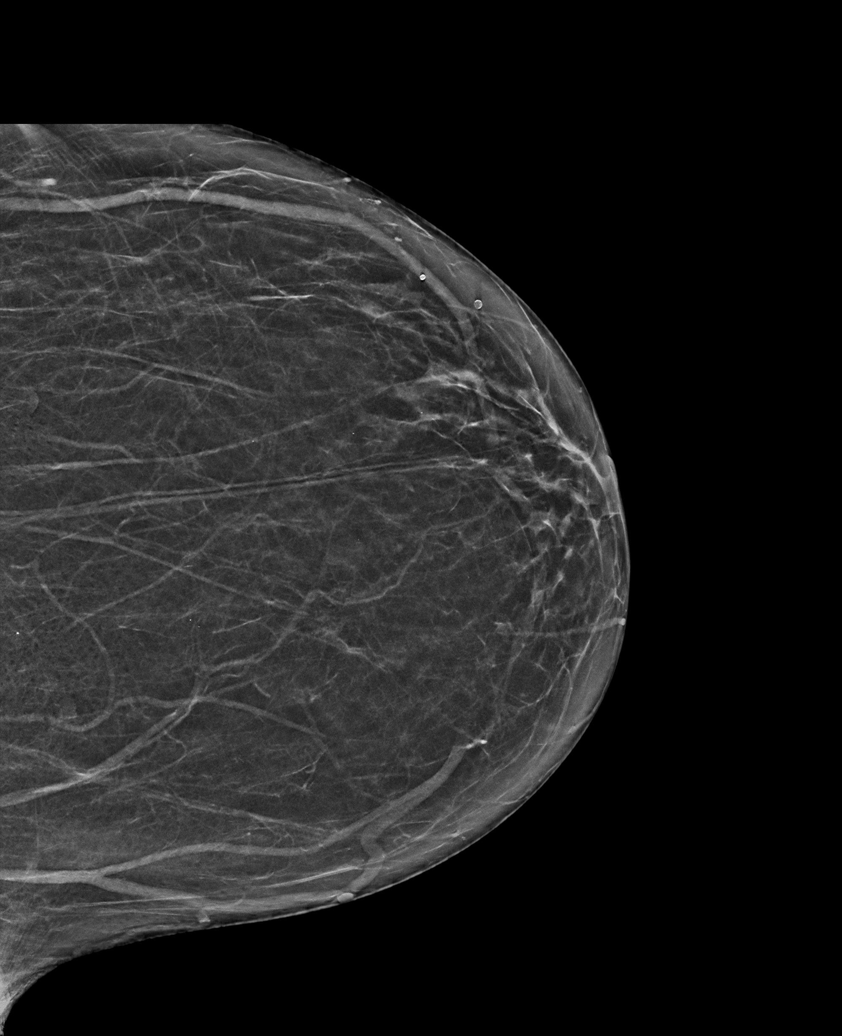

[L MLO tomo · tomo slice 37/72.0]
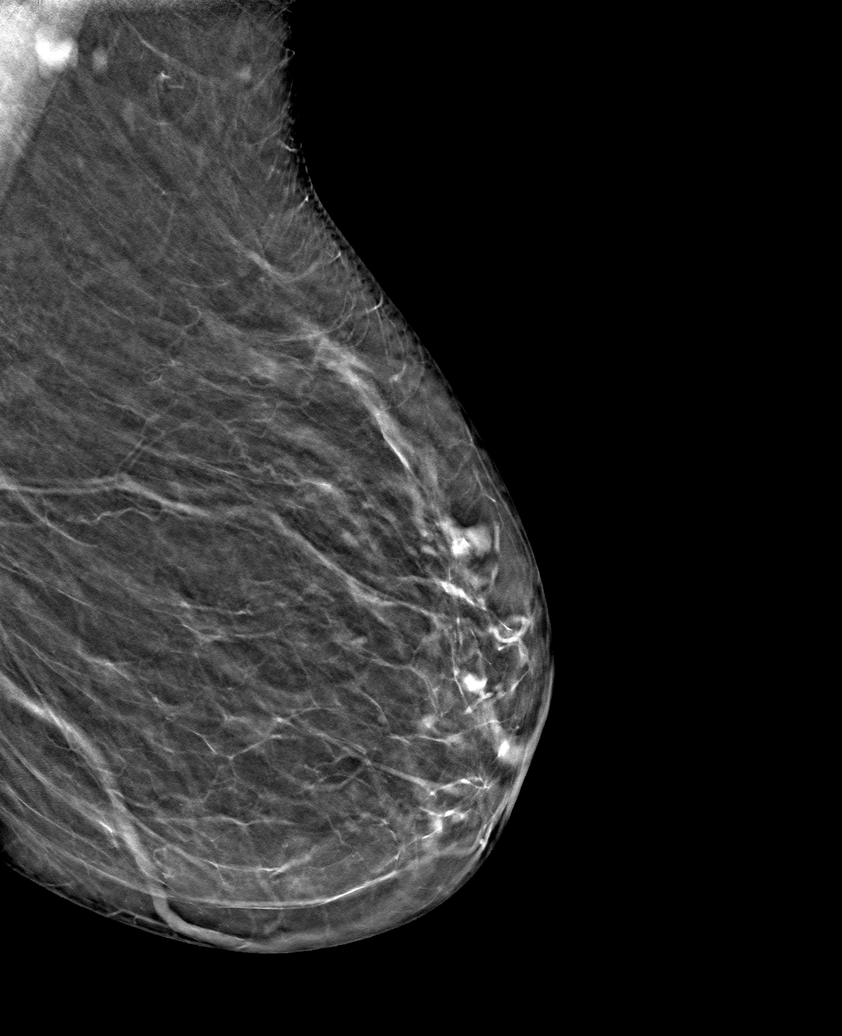

[6 of 30 positions shown; findings below may reference images not displayed]

ACR Breast Density Category b: There are scattered areas of
fibroglandular density.
FINDINGS: There are no findings suspicious for malignancy.
IMPRESSION: No mammographic evidence of malignancy. A result letter of this
screening mammogram will be mailed directly to the patient.

RECOMMENDATION:
Screening mammogram in one year. (Code:51-O-LD2)

BI-RADS CATEGORY  1: Negative.

## 2022-10-12 ENCOUNTER — Other Ambulatory Visit: Payer: Self-pay | Admitting: Family Medicine

## 2022-10-12 DIAGNOSIS — Z1231 Encounter for screening mammogram for malignant neoplasm of breast: Secondary | ICD-10-CM

## 2022-11-26 ENCOUNTER — Ambulatory Visit
Admission: RE | Admit: 2022-11-26 | Discharge: 2022-11-26 | Disposition: A | Discharge status: Home / Self Care | Payer: Medicare HMO | Source: Ambulatory Visit | Attending: Family Medicine | Admitting: Family Medicine

## 2022-11-26 DIAGNOSIS — Z1231 Encounter for screening mammogram for malignant neoplasm of breast: Secondary | ICD-10-CM

## 2023-09-11 ENCOUNTER — Other Ambulatory Visit: Payer: Self-pay | Admitting: Family Medicine

## 2023-09-11 DIAGNOSIS — Z1231 Encounter for screening mammogram for malignant neoplasm of breast: Secondary | ICD-10-CM

## 2023-11-01 ENCOUNTER — Ambulatory Visit (HOSPITAL_BASED_OUTPATIENT_CLINIC_OR_DEPARTMENT_OTHER)
Admission: RE | Admit: 2023-11-01 | Discharge: 2023-11-01 | Disposition: A | Discharge status: Home / Self Care | Source: Ambulatory Visit

## 2023-11-01 ENCOUNTER — Encounter (HOSPITAL_BASED_OUTPATIENT_CLINIC_OR_DEPARTMENT_OTHER): Payer: Self-pay

## 2023-11-01 ENCOUNTER — Ambulatory Visit (INDEPENDENT_AMBULATORY_CARE_PROVIDER_SITE_OTHER): Admit: 2023-11-01 | Discharge: 2023-11-01 | Disposition: A | Discharge status: Home / Self Care | Admitting: Radiology

## 2023-11-01 VITALS — BP 149/97 | HR 63 | Temp 97.9°F | Resp 20

## 2023-11-01 DIAGNOSIS — W19XXXA Unspecified fall, initial encounter: Secondary | ICD-10-CM

## 2023-11-01 DIAGNOSIS — M79601 Pain in right arm: Secondary | ICD-10-CM | POA: Diagnosis not present

## 2023-11-01 DIAGNOSIS — M25511 Pain in right shoulder: Secondary | ICD-10-CM | POA: Diagnosis not present

## 2023-11-01 NOTE — Discharge Instructions (Signed)
 Your x-rays not show any concerns.  These are most likely just bruises and strains from the fall.  Recommend continuing to rest the areas, Tylenol  for pain as needed.  You can do heat and ice to the area.  Follow-up as needed

## 2023-11-01 NOTE — ED Triage Notes (Addendum)
 Pt states she fell in a parking lot on Monday. She tripped over a seam in the cement walkway and fell face first. She did brace with her hand and had some bruising on her knees but they are otherwise not causing issues. Since the fall she has been having right upper arm pain (shoulder is fine) and right rib/chest pain. She is unable to lay flat or roll over in bed- the pain gets worst and she starts to feel like she cannot breath when she lays flat. She has not taken anything for the pain today.

## 2023-11-01 NOTE — ED Provider Notes (Signed)
 [facility]    CSN: [id number] Arrival date & time: 11/01/23  1116      History   Chief Complaint Chief Complaint  Patient presents with   Fall   Arm Injury    HPI Hannah Branch is a 73 y.o. female.   Pt states she fell in a parking lot on Monday. She tripped over a seam in the cement walkway and fell face first. She did brace with her hand and had some bruising on her knees but they are otherwise not causing issues. Since the fall she has been having right upper arm pain (shoulder is fine) and right rib/chest pain. She is unable to lay flat or roll over in bed- the pain gets worst and she starts to feel like she cannot breath when she lays flat. She has not taken anything for the pain today.    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      Home Medications    Prior to Admission medications   Medication Sig Start Date End Date Taking? Authorizing Provider  amLODipine (NORVASC) 5 MG tablet Take 5 mg by mouth daily.    [provider]  atorvastatin (LIPITOR) 40 MG tablet Take 40 mg by mouth daily.    [provider]  clopidogrel (PLAVIX) 75 MG tablet  Take 75 mg by mouth daily.    [provider]  metoprolol succinate (TOPROL-XL) 100 MG 24 hr tablet Take 100 mg by mouth daily.    [provider]  Multiple Vitamin (MULTIVITAMIN ADULT PO) Take 1 tablet by mouth.    [provider]  olmesartan (BENICAR) 20 MG tablet Take 20 mg by mouth daily.    [provider]  sertraline (ZOLOFT) 50 MG tablet Take 50 mg by mouth daily.    [provider]  traZODone (DESYREL) 50 MG tablet Take 50 mg by mouth at bedtime.    [provider]    Family History Family History  Problem Relation Age of Onset   Cancer Maternal Aunt        breast   Breast cancer Maternal Aunt    Cancer Maternal Grandmother        pancreatic   Emphysema Father    Heart disease Father    Breast cancer Maternal Aunt     Social History Social History   Tobacco Use   Smoking status: Never   Smokeless tobacco: Never  Vaping Use   Vaping status: Never Used  Substance Use Topics   Alcohol use: No  Drug use: No     Allergies   Patient has no known allergies.   Review of Systems Review of Systems  See HPI Physical Exam Triage Vital Signs ED Triage Vitals  Encounter Vitals Group     BP 11/01/23 1132 (!) 149/97     Girls Systolic BP Percentile --      Girls Diastolic BP Percentile --      Boys Systolic BP Percentile --      Boys Diastolic BP Percentile --      Pulse Rate 11/01/23 1132 63     Resp 11/01/23 1132 20     Temp 11/01/23 1132 97.9 F (36.6 C)     Temp Source 11/01/23 1132 Oral     SpO2 11/01/23 1132 96 %     Weight --      Height --      Head Circumference --      Peak Flow --      Pain Score 11/01/23 1128 4     Pain Loc --      Pain Education --      Exclude from Growth Chart --    No data found.  Updated Vital Signs BP (!) 149/97 (BP Location: Right Arm)   Pulse 63   Temp 97.9 F (36.6 C) (Oral)   Resp 20   SpO2 96%   Visual Acuity Right Eye Distance:   Left Eye Distance:    Bilateral Distance:    Right Eye Near:   Left Eye Near:    Bilateral Near:     Physical Exam Vitals and nursing note reviewed.  Constitutional:      General: She is not in acute distress.    Appearance: Normal appearance. She is not ill-appearing, toxic-appearing or diaphoretic.  Pulmonary:     Effort: Pulmonary effort is normal.  Musculoskeletal:        General: Normal range of motion.       Arms:  Skin:    General: Skin is warm and dry.  Neurological:     Mental Status: She is alert.  Psychiatric:        Mood and Affect: Mood normal.      UC Treatments / Results  Labs (all labs ordered are listed, but only abnormal results are displayed) Labs Reviewed - No data to display  EKG   Radiology DG Ribs Unilateral W/Chest Right Result Date: 11/01/2023 CLINICAL DATA:  Fall, arm pain EXAM: RIGHT RIBS AND CHEST - 3+ VIEW COMPARISON:  09/28/2010 FINDINGS: No fracture or other bone lesions are seen involving the ribs. There is no evidence of pneumothorax or pleural effusion. Both lungs are clear. Heart size and mediastinal contours are within normal limits. Aortic atherosclerosis. IMPRESSION: No visible rib fracture. No acute cardiopulmonary disease. Electronically Signed   By: Franky Crease M.D.   On: 11/01/2023 12:17   DG Humerus Right Result Date: 11/01/2023 CLINICAL DATA:  Arm pain, fall EXAM: RIGHT HUMERUS - 2+ VIEW COMPARISON:  None Available. FINDINGS: No acute bony abnormality. Specifically, no fracture, subluxation, or dislocation. Degenerative changes in the [facility] joint. IMPRESSION: No acute bony abnormality. Electronically Signed   By: Franky Crease M.D.   On: 11/01/2023 12:16    Procedures Procedures (including critical care time)  Medications Ordered in UC Medications - No data to display  Initial Impression / Assessment and Plan / UC Course  I have reviewed the triage vital signs and the nursing notes.  Pertinent labs & imaging results that  were available during  my care of the patient were reviewed by me and considered in my medical decision making (see chart for details).     Fall-no concerns on x-rays today.  No acute fracture.  Most likely just strained the areas.  Recommend rest, ice/heat, elevate and Tylenol  for pain as needed.  Follow-up as needed Final Clinical Impressions(s) / UC Diagnoses   Final diagnoses:  Fall, initial encounter     Discharge Instructions      Your x-rays not show any concerns.  These are most likely just bruises and strains from the fall.  Recommend continuing to rest the areas, Tylenol  for pain as needed.  You can do heat and ice to the area.  Follow-up as needed    ED Prescriptions   None    PDMP not reviewed this encounter.   Adah Wilbert LABOR, FNP 11/01/23 1229

## 2023-11-27 ENCOUNTER — Ambulatory Visit
Admission: RE | Admit: 2023-11-27 | Discharge: 2023-11-27 | Disposition: A | Source: Ambulatory Visit | Attending: Family Medicine | Admitting: Family Medicine

## 2023-11-27 DIAGNOSIS — Z1231 Encounter for screening mammogram for malignant neoplasm of breast: Secondary | ICD-10-CM
